# Patient Record
Sex: Female | Born: 1955 | Race: White | Hispanic: No | Marital: Married | Smoking: Never smoker
Health system: Southern US, Community
[De-identification: ages and names within clinical notes are randomized; demographics above are authoritative.]

## PROBLEM LIST (undated history)

## (undated) DIAGNOSIS — R319 Hematuria, unspecified: Secondary | ICD-10-CM

## (undated) DIAGNOSIS — R202 Paresthesia of skin: Secondary | ICD-10-CM

## (undated) DIAGNOSIS — L608 Other nail disorders: Secondary | ICD-10-CM

## (undated) DIAGNOSIS — R300 Dysuria: Secondary | ICD-10-CM

## (undated) DIAGNOSIS — G43909 Migraine, unspecified, not intractable, without status migrainosus: Secondary | ICD-10-CM

## (undated) DIAGNOSIS — B9681 Helicobacter pylori [H. pylori] as the cause of diseases classified elsewhere: Secondary | ICD-10-CM

## (undated) DIAGNOSIS — E559 Vitamin D deficiency, unspecified: Secondary | ICD-10-CM

## (undated) DIAGNOSIS — N644 Mastodynia: Secondary | ICD-10-CM

## (undated) DIAGNOSIS — I10 Essential (primary) hypertension: Secondary | ICD-10-CM

## (undated) DIAGNOSIS — H9190 Unspecified hearing loss, unspecified ear: Secondary | ICD-10-CM

## (undated) DIAGNOSIS — E049 Nontoxic goiter, unspecified: Secondary | ICD-10-CM

## (undated) DIAGNOSIS — R079 Chest pain, unspecified: Secondary | ICD-10-CM

## (undated) DIAGNOSIS — I8393 Asymptomatic varicose veins of bilateral lower extremities: Secondary | ICD-10-CM

## (undated) DIAGNOSIS — N200 Calculus of kidney: Secondary | ICD-10-CM

## (undated) DIAGNOSIS — K219 Gastro-esophageal reflux disease without esophagitis: Secondary | ICD-10-CM

## (undated) DIAGNOSIS — E785 Hyperlipidemia, unspecified: Secondary | ICD-10-CM

## (undated) HISTORY — DX: Dysuria: R30.0

## (undated) HISTORY — PX: TONSILLECTOMY: SUR1361

## (undated) HISTORY — DX: Paresthesia of skin: R20.2

## (undated) HISTORY — DX: Essential (primary) hypertension: I10

## (undated) HISTORY — DX: Nontoxic goiter, unspecified: E04.9

## (undated) HISTORY — DX: Hyperlipidemia, unspecified: E78.5

## (undated) HISTORY — DX: Other nail disorders: L60.8

## (undated) HISTORY — DX: Gastro-esophageal reflux disease without esophagitis: K21.9

## (undated) HISTORY — DX: Chest pain, unspecified: R07.9

## (undated) HISTORY — PX: EYE SURGERY: SHX253

## (undated) HISTORY — DX: Vitamin D deficiency, unspecified: E55.9

## (undated) HISTORY — DX: Asymptomatic varicose veins of bilateral lower extremities: I83.93

## (undated) HISTORY — DX: Unspecified hearing loss, unspecified ear: H91.90

## (undated) HISTORY — DX: Helicobacter pylori (H. pylori) as the cause of diseases classified elsewhere: B96.81

## (undated) HISTORY — DX: Mastodynia: N64.4

## (undated) HISTORY — DX: Migraine, unspecified, not intractable, without status migrainosus: G43.909

## (undated) HISTORY — DX: Calculus of kidney: N20.0

## (undated) HISTORY — DX: Hematuria, unspecified: R31.9

## (undated) HISTORY — PX: OTHER SURGICAL HISTORY: SHX169

---

## 1997-08-30 ENCOUNTER — Ambulatory Visit (HOSPITAL_COMMUNITY): Admission: RE | Admit: 1997-08-30 | Discharge: 1997-08-30 | Payer: Self-pay | Admitting: Gynecology

## 1997-12-06 ENCOUNTER — Other Ambulatory Visit: Admission: RE | Admit: 1997-12-06 | Discharge: 1997-12-06 | Payer: Self-pay | Admitting: Gynecology

## 1998-04-06 ENCOUNTER — Ambulatory Visit (HOSPITAL_COMMUNITY): Admission: RE | Admit: 1998-04-06 | Discharge: 1998-04-06 | Payer: Self-pay | Admitting: Gynecology

## 1998-04-06 ENCOUNTER — Encounter: Payer: Self-pay | Admitting: Gynecology

## 1998-09-02 ENCOUNTER — Encounter: Payer: Self-pay | Admitting: Gynecology

## 1998-09-02 ENCOUNTER — Ambulatory Visit (HOSPITAL_COMMUNITY): Admission: RE | Admit: 1998-09-02 | Discharge: 1998-09-02 | Payer: Self-pay | Admitting: Gynecology

## 1998-10-03 ENCOUNTER — Other Ambulatory Visit: Admission: RE | Admit: 1998-10-03 | Discharge: 1998-10-03 | Payer: Self-pay | Admitting: Gynecology

## 2000-01-15 ENCOUNTER — Other Ambulatory Visit: Admission: RE | Admit: 2000-01-15 | Discharge: 2000-01-15 | Payer: Self-pay | Admitting: Gynecology

## 2000-10-19 ENCOUNTER — Emergency Department (HOSPITAL_COMMUNITY): Admission: EM | Admit: 2000-10-19 | Discharge: 2000-10-19 | Payer: Self-pay

## 2000-10-19 ENCOUNTER — Encounter: Payer: Self-pay | Admitting: Emergency Medicine

## 2000-11-04 ENCOUNTER — Ambulatory Visit (HOSPITAL_BASED_OUTPATIENT_CLINIC_OR_DEPARTMENT_OTHER): Admission: RE | Admit: 2000-11-04 | Discharge: 2000-11-04 | Payer: Self-pay | Admitting: Orthopedic Surgery

## 2002-05-20 ENCOUNTER — Encounter: Payer: Self-pay | Admitting: Internal Medicine

## 2002-05-20 ENCOUNTER — Encounter: Admission: RE | Admit: 2002-05-20 | Discharge: 2002-05-20 | Payer: Self-pay | Admitting: Internal Medicine

## 2002-05-27 ENCOUNTER — Encounter: Admission: RE | Admit: 2002-05-27 | Discharge: 2002-05-27 | Payer: Self-pay | Admitting: Internal Medicine

## 2002-05-27 ENCOUNTER — Encounter: Payer: Self-pay | Admitting: Internal Medicine

## 2003-05-31 ENCOUNTER — Encounter: Admission: RE | Admit: 2003-05-31 | Discharge: 2003-05-31 | Payer: Self-pay | Admitting: Internal Medicine

## 2004-08-22 ENCOUNTER — Other Ambulatory Visit: Admission: RE | Admit: 2004-08-22 | Discharge: 2004-08-22 | Payer: Self-pay | Admitting: Gynecology

## 2004-09-20 ENCOUNTER — Encounter: Admission: RE | Admit: 2004-09-20 | Discharge: 2004-09-20 | Payer: Self-pay | Admitting: Internal Medicine

## 2005-03-05 ENCOUNTER — Encounter: Admission: RE | Admit: 2005-03-05 | Discharge: 2005-03-05 | Payer: Self-pay | Admitting: Otolaryngology

## 2005-10-23 ENCOUNTER — Encounter: Admission: RE | Admit: 2005-10-23 | Discharge: 2005-10-23 | Payer: Self-pay | Admitting: Internal Medicine

## 2005-10-29 ENCOUNTER — Encounter: Admission: RE | Admit: 2005-10-29 | Discharge: 2005-10-29 | Payer: Self-pay | Admitting: Internal Medicine

## 2005-12-04 ENCOUNTER — Ambulatory Visit: Payer: Self-pay | Admitting: Gastroenterology

## 2005-12-19 ENCOUNTER — Ambulatory Visit: Payer: Self-pay | Admitting: Gastroenterology

## 2006-01-10 ENCOUNTER — Ambulatory Visit: Payer: Self-pay | Admitting: Gastroenterology

## 2006-11-06 ENCOUNTER — Encounter: Admission: RE | Admit: 2006-11-06 | Discharge: 2006-11-06 | Payer: Self-pay | Admitting: Gynecology

## 2006-11-19 ENCOUNTER — Ambulatory Visit: Payer: Self-pay | Admitting: Gastroenterology

## 2006-12-04 ENCOUNTER — Encounter: Payer: Self-pay | Admitting: Gastroenterology

## 2006-12-04 ENCOUNTER — Ambulatory Visit: Payer: Self-pay | Admitting: Gastroenterology

## 2006-12-04 DIAGNOSIS — K2970 Gastritis, unspecified, without bleeding: Secondary | ICD-10-CM | POA: Insufficient documentation

## 2006-12-04 DIAGNOSIS — K2990 Gastroduodenitis, unspecified, without bleeding: Secondary | ICD-10-CM

## 2008-01-15 ENCOUNTER — Encounter: Admission: RE | Admit: 2008-01-15 | Discharge: 2008-01-15 | Payer: Self-pay | Admitting: Internal Medicine

## 2009-01-27 ENCOUNTER — Encounter: Admission: RE | Admit: 2009-01-27 | Discharge: 2009-01-27 | Payer: Self-pay | Admitting: Internal Medicine

## 2009-03-22 ENCOUNTER — Encounter: Payer: Self-pay | Admitting: Physician Assistant

## 2009-03-28 ENCOUNTER — Ambulatory Visit: Payer: Self-pay | Admitting: Gastroenterology

## 2009-03-28 ENCOUNTER — Telehealth: Payer: Self-pay | Admitting: Gastroenterology

## 2009-03-28 DIAGNOSIS — R1032 Left lower quadrant pain: Secondary | ICD-10-CM | POA: Insufficient documentation

## 2009-03-28 DIAGNOSIS — K5900 Constipation, unspecified: Secondary | ICD-10-CM | POA: Insufficient documentation

## 2009-04-01 ENCOUNTER — Telehealth: Payer: Self-pay | Admitting: Physician Assistant

## 2009-04-06 ENCOUNTER — Telehealth (INDEPENDENT_AMBULATORY_CARE_PROVIDER_SITE_OTHER): Payer: Self-pay | Admitting: *Deleted

## 2010-02-22 ENCOUNTER — Encounter: Admission: RE | Admit: 2010-02-22 | Discharge: 2010-02-22 | Payer: Self-pay | Admitting: Internal Medicine

## 2010-09-12 NOTE — Assessment & Plan Note (Signed)
[facility]                         GASTROENTEROLOGY OFFICE NOTE   YAZLEEN, MOLOCK                      MRN:          [id number]  DATE:11/19/2006                            DOB:          [birth year]    Mrs. Hepner recently had guaiac-positive stool on Hemoccult testing.  Her  last colonoscopy was August of 2007 which was negative, although she had  extremely poor colon prep.  She now relates that she does have some  relatives in [place] who have had colon polyps.   Mrs. Lento denies any GI complaints.  She takes as many as 4-6 Aleve  tablets a day for migraine headaches.  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  I will go ahead and repeat her colonoscopy and endoscopy,  complete her workup with further clinical action depending on the  results of these tests.  Recent CBC and metabolic profile by Dr. Timothy Lasso  was normal.     Vania Rea. Jarold Motto, MD, Caleen Essex, FAGA  Electronically Signed    DRP/MedQ  DD: 11/19/2006  DT: 11/19/2006  Job #: [id number]   cc:   Gwen Pounds, MD

## 2010-09-15 NOTE — Op Note (Signed)
[place]  Patient:    Ashley Cline, Ashley Cline                      MRN: [id number] Proc. Date: 11/04/00 Adm. Date:  [id number] Attending:  Marlowe Shores                           Operative Report  PREOPERATIVE DIAGNOSES:  Left distal radius fracture.  POSTOPERATIVE DIAGNOSES:  Left distal radius fracture.  PROCEDURE:  Open reduction internal fixation of above fracture using DVR plate and screws.  SURGEON:  Artist Pais. Mina Marble, M.D.  ASSISTANT:  RN  ANESTHESIA:  Axillary block.  TOURNIQUET TIME:  45 minutes.  COMPLICATIONS:  None.  DRAINS:  None.  OPERATIVE REPORT:  Patient was taken to the operating room after induction of adequate axillary block anesthesia and IV sedation.  Left upper extremity was prepped and draped in the usual sterile fashion.  An Esmarch was used to exsanguinate the limb.  Tourniquet was inflated to 250 mmHg at this point in time.  The standard Sherilyn Cooter approach to the distal radius on the lower side was undertaken.  A 6 cm incision was made over the FCR tendon.  Incision was taken down through the skin and subcutaneous tissues until the sheath overlying the FCR tendon was identified.  This was split longitudinally.  The FCR was retracted to the midline, the radial artery laterally, and this interval was developed.  The ______ was identified and the subperiostea dissected off the distal radius.  The fracture was debrided of clot.  Reduction was performed. The plate was then placed volarly using the preset screws and pegs. Intraoperative x-rays showed good reduction in both the AP and lateral planes. The ______ was then repaired using 3-0 Vicryl and the skin with a running 3-0 Prolene subcuticular stitch.  Steri-Strips, 4 x 4 Plus, compression dressing was applied.  Patient tolerated well and went to recovery in stable condition. DD:  11/04/00 TD:  11/04/00 Job: [id number]

## 2010-09-15 NOTE — Assessment & Plan Note (Signed)
[facility]                           GASTROENTEROLOGY OFFICE NOTE   LOURENE, HOSTON                      MRN:          [id number]  DATE:01/10/2006                            DOB:          December 26, 1955    A screening colonoscopy December 19, 2005 was unremarkable, although there was  extremely poor colon prep.  I therefore advised her to have followup exam in  5 years time with a Vascol least possible sodium pill prep.  She denies any  GI complaints whatsoever.  Has a negative family history of any  gastrointestinal problems.  She has agreed to this plan.  Continue medical  followup with Dr. Timothy Lasso as previously as planned.                                   Vania Rea. Jarold Motto, MD, Clementeen Graham, [place]   DRP/MedQ  DD:  01/10/2006  DT:  01/11/2006  Job #:  [id number]

## 2010-09-15 NOTE — Consult Note (Signed)
[place]  Patient:    Ashley Cline, Ashley Cline                      MRN: [id number] Proc. Date: 10/19/00 Adm. Date:  [id number] Disc. Date: [id number] Attending:  Armanda Heritage CC:         Earlyne Iba, M.D.   Consultation Report  REQUESTING PHYSICIAN:  Earlyne Iba, M.D.  REASON FOR CONSULTATION:  Ashley Cline is a very pleasant 55 year old right-hand dominant female who fell earlier today in her garden sustaining trauma to her left upper extremity and presents today with pain and deformity to her left hand and wrist.  She is an otherwise healthy 55 year old female with no known drug allergies, no current medications, no recent hospitalization or surgery.  FAMILY MEDICAL HISTORY:  Noncontributory.  SOCIAL HISTORY:  Noncontributory.  PHYSICAL EXAMINATION:  A well-developed well-nourished female, pleasant and alert and oriented x 3.  On examination of her left upper extremity, she has an obvious injury to her wrist area with dorsally displaced hand on the wrist.  She is neurovascularly intact grossly.  Her median, ulnar and radial nerves are functional.  She has 2+ radial pulses which refill.  There is no erythema, drainage or sign of infection.  She can move her digits but again there is an obvious deformity to the hand and wrist.  X-rays show a dorsally displaced fracture of the distal radius with some slight interarticular involvement in the area of the radial styloid.  Today she was given some IV sedation of morphine and Phenergan and then a 2% plain lidocaine hematoma block was administered.  Once adequate anesthesia was obtained she was placed in finger trap traction with 10 pounds of countertraction across the radial carpal joints and a closed reduction was performed.  She was placed in sugar-tong splint.  Postoperative films showed adequate reduction in both the AP and lateral planes.  She was discharged from the emergency department  with Percocet for pain 1 or 2 every 3-4 hours as needed #30, no refills and is to follow up in my office on Tuesday, October 22, 2000.  I discussed with Ashley Cline and her husband that at this point in time the reduction appears to be fairly good but because of the nature of this fracture she could be displaced and this might require operative fixation.  They are also to get back to me immediately with any signs of neurovascular compromise or acute carpal tunnel syndrome.  Also of note she has 4 rings on the ring finger on that side and we were unable to remove them here in the emergency department, however, her finger looks viable and there is no evidence of congestion, however, should there be any discoloration of the finger, coldness, numbness, etc they are to contact me immediately in which case the rings will have to be removed. DD:  10/19/00 TD:  10/21/00 Job: [id number]

## 2011-02-05 ENCOUNTER — Encounter: Payer: Self-pay | Admitting: Gastroenterology

## 2011-03-07 ENCOUNTER — Other Ambulatory Visit: Payer: Self-pay | Admitting: Internal Medicine

## 2011-03-07 DIAGNOSIS — Z1231 Encounter for screening mammogram for malignant neoplasm of breast: Secondary | ICD-10-CM

## 2011-03-13 ENCOUNTER — Ambulatory Visit
Admission: RE | Admit: 2011-03-13 | Discharge: 2011-03-13 | Disposition: A | Discharge status: Home / Self Care | Payer: BC Managed Care – PPO | Source: Ambulatory Visit | Attending: Internal Medicine | Admitting: Internal Medicine

## 2011-03-13 DIAGNOSIS — Z1231 Encounter for screening mammogram for malignant neoplasm of breast: Secondary | ICD-10-CM

## 2012-03-24 ENCOUNTER — Other Ambulatory Visit: Payer: Self-pay | Admitting: Internal Medicine

## 2012-03-24 DIAGNOSIS — Z1231 Encounter for screening mammogram for malignant neoplasm of breast: Secondary | ICD-10-CM

## 2012-03-25 ENCOUNTER — Ambulatory Visit
Admission: RE | Admit: 2012-03-25 | Discharge: 2012-03-25 | Disposition: A | Discharge status: Home / Self Care | Payer: BC Managed Care – PPO | Source: Ambulatory Visit | Attending: Internal Medicine | Admitting: Internal Medicine

## 2012-03-25 DIAGNOSIS — Z1231 Encounter for screening mammogram for malignant neoplasm of breast: Secondary | ICD-10-CM

## 2012-04-22 ENCOUNTER — Ambulatory Visit: Payer: BC Managed Care – PPO

## 2013-03-03 ENCOUNTER — Other Ambulatory Visit: Payer: Self-pay

## 2013-03-03 DIAGNOSIS — Z1231 Encounter for screening mammogram for malignant neoplasm of breast: Secondary | ICD-10-CM

## 2013-04-03 ENCOUNTER — Ambulatory Visit
Admission: RE | Admit: 2013-04-03 | Discharge: 2013-04-03 | Disposition: A | Discharge status: Home / Self Care | Payer: BC Managed Care – PPO | Source: Ambulatory Visit

## 2013-04-03 DIAGNOSIS — Z1231 Encounter for screening mammogram for malignant neoplasm of breast: Secondary | ICD-10-CM

## 2013-07-01 ENCOUNTER — Other Ambulatory Visit: Payer: Self-pay | Admitting: Internal Medicine

## 2013-07-01 DIAGNOSIS — R3129 Other microscopic hematuria: Secondary | ICD-10-CM

## 2013-07-03 ENCOUNTER — Ambulatory Visit
Admission: RE | Admit: 2013-07-03 | Discharge: 2013-07-03 | Disposition: A | Discharge status: Home / Self Care | Payer: BC Managed Care – PPO | Source: Ambulatory Visit | Attending: Internal Medicine | Admitting: Internal Medicine

## 2013-07-03 DIAGNOSIS — R3129 Other microscopic hematuria: Secondary | ICD-10-CM

## 2014-05-07 ENCOUNTER — Other Ambulatory Visit: Payer: Self-pay

## 2014-05-07 DIAGNOSIS — Z1231 Encounter for screening mammogram for malignant neoplasm of breast: Secondary | ICD-10-CM

## 2014-05-19 ENCOUNTER — Ambulatory Visit
Admission: RE | Admit: 2014-05-19 | Discharge: 2014-05-19 | Disposition: A | Discharge status: Home / Self Care | Payer: BLUE CROSS/BLUE SHIELD | Source: Ambulatory Visit

## 2014-05-19 DIAGNOSIS — Z1231 Encounter for screening mammogram for malignant neoplasm of breast: Secondary | ICD-10-CM

## 2015-02-18 ENCOUNTER — Inpatient Hospital Stay (HOSPITAL_COMMUNITY)
Admission: AD | Admit: 2015-02-18 | Discharge: 2015-02-18 | Discharge status: Against Medical Advice | Payer: BLUE CROSS/BLUE SHIELD | Source: Ambulatory Visit | Attending: Obstetrics and Gynecology | Admitting: Obstetrics and Gynecology

## 2015-02-18 NOTE — MAU Note (Signed)
Pt signed in with pain stated now she has no more and wants to go home. Told her would would be glad to check her out but stated she would see Dr. Virgina Jock (her family doctor) on Monday.

## 2015-05-18 ENCOUNTER — Other Ambulatory Visit: Payer: Self-pay

## 2015-05-18 DIAGNOSIS — Z1231 Encounter for screening mammogram for malignant neoplasm of breast: Secondary | ICD-10-CM

## 2015-06-08 ENCOUNTER — Ambulatory Visit
Admission: RE | Admit: 2015-06-08 | Discharge: 2015-06-08 | Disposition: A | Discharge status: Home / Self Care | Payer: BLUE CROSS/BLUE SHIELD | Source: Ambulatory Visit

## 2015-06-08 DIAGNOSIS — Z1231 Encounter for screening mammogram for malignant neoplasm of breast: Secondary | ICD-10-CM

## 2016-06-21 ENCOUNTER — Other Ambulatory Visit: Payer: Self-pay | Admitting: Otolaryngology

## 2016-06-21 DIAGNOSIS — H6043 Cholesteatoma of external ear, bilateral: Secondary | ICD-10-CM

## 2016-06-21 DIAGNOSIS — H6612 Chronic tubotympanic suppurative otitis media, left ear: Secondary | ICD-10-CM

## 2016-06-29 ENCOUNTER — Ambulatory Visit
Admission: RE | Admit: 2016-06-29 | Discharge: 2016-06-29 | Disposition: A | Discharge status: Home / Self Care | Payer: BLUE CROSS/BLUE SHIELD | Source: Ambulatory Visit | Attending: Otolaryngology | Admitting: Otolaryngology

## 2016-06-29 DIAGNOSIS — H6043 Cholesteatoma of external ear, bilateral: Secondary | ICD-10-CM

## 2016-06-29 DIAGNOSIS — H6612 Chronic tubotympanic suppurative otitis media, left ear: Secondary | ICD-10-CM

## 2016-06-29 MED ORDER — IOPAMIDOL (ISOVUE-300) INJECTION 61%
75.0000 mL | Freq: Once | INTRAVENOUS | Status: AC | PRN
  Administered 2016-06-29: 75 mL via INTRAVENOUS

## 2016-07-10 ENCOUNTER — Other Ambulatory Visit: Payer: Self-pay | Admitting: Internal Medicine

## 2016-07-10 DIAGNOSIS — Z1231 Encounter for screening mammogram for malignant neoplasm of breast: Secondary | ICD-10-CM

## 2016-07-13 ENCOUNTER — Ambulatory Visit
Admission: RE | Admit: 2016-07-13 | Discharge: 2016-07-13 | Disposition: A | Discharge status: Home / Self Care | Payer: BLUE CROSS/BLUE SHIELD | Source: Ambulatory Visit | Attending: Internal Medicine | Admitting: Internal Medicine

## 2016-07-13 DIAGNOSIS — Z1231 Encounter for screening mammogram for malignant neoplasm of breast: Secondary | ICD-10-CM

## 2016-10-01 ENCOUNTER — Other Ambulatory Visit: Payer: Self-pay | Admitting: Otolaryngology

## 2016-10-22 ENCOUNTER — Encounter: Payer: Self-pay | Admitting: Gastroenterology

## 2017-02-04 ENCOUNTER — Encounter: Payer: Self-pay | Admitting: Gastroenterology

## 2017-03-29 ENCOUNTER — Other Ambulatory Visit: Payer: Self-pay

## 2017-03-29 ENCOUNTER — Ambulatory Visit (AMBULATORY_SURGERY_CENTER): Payer: Self-pay | Admitting: *Deleted

## 2017-03-29 VITALS — Ht 62.0 in | Wt 141.0 lb

## 2017-03-29 DIAGNOSIS — Z1211 Encounter for screening for malignant neoplasm of colon: Secondary | ICD-10-CM

## 2017-03-29 NOTE — Progress Notes (Signed)
No egg or soy allergy known to patient  No issues with past sedation with any surgeries  or procedures, no intubation problems  No diet pills per patient No home 02 use per patient  No blood thinners per patient  Pt has  issues with constipation takes miralax daily No A fib or A flutter  EMMI video sent to pt's e mail pt. Declined  Sample of Plenvu given to patient [id number] lot# Exp.  09/2018

## 2017-04-12 ENCOUNTER — Ambulatory Visit (AMBULATORY_SURGERY_CENTER): Payer: BLUE CROSS/BLUE SHIELD | Admitting: Gastroenterology

## 2017-04-12 ENCOUNTER — Other Ambulatory Visit: Payer: Self-pay

## 2017-04-12 ENCOUNTER — Encounter: Payer: Self-pay | Admitting: Gastroenterology

## 2017-04-12 VITALS — BP 106/75 | HR 62 | Temp 96.4°F | Resp 14 | Ht 62.0 in | Wt 141.0 lb

## 2017-04-12 DIAGNOSIS — D123 Benign neoplasm of transverse colon: Secondary | ICD-10-CM | POA: Diagnosis not present

## 2017-04-12 DIAGNOSIS — Z1211 Encounter for screening for malignant neoplasm of colon: Secondary | ICD-10-CM | POA: Diagnosis not present

## 2017-04-12 MED ORDER — SODIUM CHLORIDE 0.9 % IV SOLN: 500.0000 mL | INTRAVENOUS | Status: DC

## 2017-04-12 NOTE — Progress Notes (Signed)
Report to PACU, RN, vss, BBS= Clear.  

## 2017-04-12 NOTE — Progress Notes (Signed)
Called to room to assist during endoscopic procedure.  Patient ID and intended procedure confirmed with present staff. Received instructions for my participation in the procedure from the performing physician.  

## 2017-04-12 NOTE — Progress Notes (Signed)
Pt. Reports no change in her medical or surgical history since her pre-visit 03/29/2017.

## 2017-04-12 NOTE — Op Note (Addendum)
[place] Patient Name: Ashley Cline Procedure Date: 04/12/2017 9:12 AM MRN: [id number] Endoscopist: Mauri Pole , MD Age: 61 Referring MD:  Date of Birth: 1955-07-16 Gender: Female Account #: [id number] Procedure:                Colonoscopy Indications:              Screening for colorectal malignant neoplasm, Last                            colonoscopy: 2008 Medicines:                Monitored Anesthesia Care Procedure:                Pre-Anesthesia Assessment:                           - Prior to the procedure, a History and Physical                            was performed, and patient medications and                            allergies were reviewed. The patient's tolerance of                            previous anesthesia was also reviewed. The risks                            and benefits of the procedure and the sedation                            options and risks were discussed with the patient.                            All questions were answered, and informed consent                            was obtained. Prior Anticoagulants: The patient has                            taken no previous anticoagulant or antiplatelet                            agents. ASA Grade Assessment: I - A normal, healthy                            patient. After reviewing the risks and benefits,                            the patient was deemed in satisfactory condition to                            undergo the procedure.  After obtaining informed consent, the colonoscope                            was passed under direct vision. Throughout the                            procedure, the patient's blood pressure, pulse, and                            oxygen saturations were monitored continuously. The                            Colonoscope was introduced through the anus and                            advanced to the the cecum, identified by                             appendiceal orifice and ileocecal valve. The                            colonoscopy was somewhat difficult due to                            inadequate bowel prep. The patient tolerated the                            procedure well. The quality of the bowel                            preparation was inadequate. The ileocecal valve,                            appendiceal orifice, and rectum were photographed. Scope In: 9:22:31 AM Scope Out: 9:36:00 AM Scope Withdrawal Time: 0 hours 9 minutes 0 seconds  Total Procedure Duration: 0 hours 13 minutes 29 seconds  Findings:                 The perianal and digital rectal examinations were                            normal.                           A 7 mm polyp was found in the transverse colon. The                            polyp was sessile. The polyp was removed with a                            cold snare. Resection and retrieval were complete.                           Non-bleeding internal hemorrhoids were found during  retroflexion. The hemorrhoids were small. Complications:            No immediate complications. Estimated Blood Loss:     Estimated blood loss was minimal. Impression:               - Preparation of the colon was inadequate.                           - One 7 mm polyp in the transverse colon, removed                            with a cold snare. Resected and retrieved.                           - Non-bleeding internal hemorrhoids. Recommendation:           - Patient has a contact number available for                            emergencies. The signs and symptoms of potential                            delayed complications were discussed with the                            patient. Return to normal activities tomorrow.                            Written discharge instructions were provided to the                            patient.                           - Resume previous  diet.                           - Continue present medications.                           - Await pathology results.                           - Repeat colonoscopy at the next available                            appointment because the bowel preparation was                            suboptimal.                           - Return to GI clinic PRN for hemorrhoidal band                            ligation. Mauri Pole, MD 04/12/2017 9:40:38 AM This report has been signed electronically.

## 2017-04-12 NOTE — Patient Instructions (Addendum)
YOU HAD AN ENDOSCOPIC PROCEDURE TODAY AT [facility]:   Refer to the procedure report that was given to you for any specific questions about what was found during the examination.  If the procedure report does not answer your questions, please call your gastroenterologist to clarify.  If you requested that your care partner not be given the details of your procedure findings, then the procedure report has been included in a sealed envelope for you to review at your convenience later.  YOU SHOULD EXPECT: Some feelings of bloating in the abdomen. Passage of more gas than usual.  Walking can help get rid of the air that was put into your GI tract during the procedure and reduce the bloating. If you had a lower endoscopy (such as a colonoscopy or flexible sigmoidoscopy) you may notice spotting of blood in your stool or on the toilet paper. If you underwent a bowel prep for your procedure, you may not have a normal bowel movement for a few days.  Please Note:  You might notice some irritation and congestion in your nose or some drainage.  This is from the oxygen used during your procedure.  There is no need for concern and it should clear up in a day or so.  SYMPTOMS TO REPORT IMMEDIATELY:   Following lower endoscopy (colonoscopy or flexible sigmoidoscopy):  Excessive amounts of blood in the stool  Significant tenderness or worsening of abdominal pains  Swelling of the abdomen that is new, acute  Fever of 100F or higher  For urgent or emergent issues, a gastroenterologist can be reached at any hour by calling [phone].   DIET:  We do recommend a small meal at first, but then you may proceed to your regular diet.  Drink plenty of fluids but you should avoid alcoholic beverages for 24 hours.  ACTIVITY:  You should plan to take it easy for the rest of today and you should NOT DRIVE or use heavy machinery until tomorrow (because of the sedation medicines used during the test).     FOLLOW UP: Our staff will call the number listed on your records the next business day following your procedure to check on you and address any questions or concerns that you may have regarding the information given to you following your procedure. If we do not reach you, we will leave a message.  However, if you are feeling well and you are not experiencing any problems, there is no need to return our call.  We will assume that you have returned to your regular daily activities without incident.  If any biopsies were taken you will be contacted by phone or by letter within the next 1-3 weeks.  Please call us at [phone] if you have not heard about the biopsies in 3 weeks.   Await for biopsy  Return to GI next week for repeat Colonoscopy Hemorrhoid (handout given) Hemorrhoid Banding (handout given)   SIGNATURES/CONFIDENTIALITY: You and/or your care partner have signed paperwork which will be entered into your electronic medical record.  These signatures attest to the fact that that the information above on your After Visit Summary has been reviewed and is understood.  Full responsibility of the confidentiality of this discharge information lies with you and/or your care-partner.

## 2017-04-15 ENCOUNTER — Ambulatory Visit (AMBULATORY_SURGERY_CENTER): Payer: Self-pay | Admitting: *Deleted

## 2017-04-15 ENCOUNTER — Other Ambulatory Visit: Payer: Self-pay

## 2017-04-15 ENCOUNTER — Telehealth: Payer: Self-pay | Admitting: *Deleted

## 2017-04-15 VITALS — Ht 62.0 in | Wt 137.0 lb

## 2017-04-15 DIAGNOSIS — Z1211 Encounter for screening for malignant neoplasm of colon: Secondary | ICD-10-CM

## 2017-04-15 MED ORDER — NA SULFATE-K SULFATE-MG SULF 17.5-3.13-1.6 GM/177ML PO SOLN: ORAL | 0 refills | Status: DC

## 2017-04-15 NOTE — Telephone Encounter (Signed)
  Follow up Call-  Call back number 04/12/2017  Post procedure Call Back phone  # [phone] or [phone]  Permission to leave phone message Yes  Some recent data might be hidden     Patient questions:  Do you have a fever, pain , or abdominal swelling? No. Pain Score  0 *  Have you tolerated food without any problems? Yes.    Have you been able to return to your normal activities? Yes.    Do you have any questions about your discharge instructions: Diet   No. Medications  No. Follow up visit  No.  Do you have questions or concerns about your Care? No.  Actions: * If pain score is 4 or above: No action needed, pain <4.

## 2017-04-15 NOTE — Progress Notes (Signed)
Patient denies any allergies to eggs or soy. Patient denies any problems with anesthesia/sedation. Patient denies any oxygen use at home. Patient denies taking any diet/weight loss medications or blood thinners. Patient was given 2 day prep instructions for Miralax/Dulcolax and Suprep. Pt verbalizes understanding of prep.

## 2017-04-19 ENCOUNTER — Other Ambulatory Visit: Payer: Self-pay

## 2017-04-19 ENCOUNTER — Encounter: Payer: Self-pay | Admitting: Gastroenterology

## 2017-04-19 ENCOUNTER — Ambulatory Visit (AMBULATORY_SURGERY_CENTER): Payer: BLUE CROSS/BLUE SHIELD | Admitting: Gastroenterology

## 2017-04-19 VITALS — BP 115/56 | HR 63 | Temp 98.4°F | Resp 16 | Ht 62.0 in | Wt 137.0 lb

## 2017-04-19 DIAGNOSIS — D122 Benign neoplasm of ascending colon: Secondary | ICD-10-CM | POA: Diagnosis not present

## 2017-04-19 DIAGNOSIS — K635 Polyp of colon: Secondary | ICD-10-CM | POA: Diagnosis not present

## 2017-04-19 DIAGNOSIS — Z1211 Encounter for screening for malignant neoplasm of colon: Secondary | ICD-10-CM

## 2017-04-19 DIAGNOSIS — D124 Benign neoplasm of descending colon: Secondary | ICD-10-CM

## 2017-04-19 MED ORDER — SODIUM CHLORIDE 0.9 % IV SOLN: 500.0000 mL | Freq: Once | INTRAVENOUS | Status: DC

## 2017-04-19 NOTE — Patient Instructions (Signed)
**  Handouts given on polyps and hemorrhoids**   YOU HAD AN ENDOSCOPIC PROCEDURE TODAY: Refer to the procedure report and other information in the discharge instructions given to you for any specific questions about what was found during the examination. If this information does not answer your questions, please call [place] office at [phone] to clarify.   YOU SHOULD EXPECT: Some feelings of bloating in the abdomen. Passage of more gas than usual. Walking can help get rid of the air that was put into your GI tract during the procedure and reduce the bloating. If you had a lower endoscopy (such as a colonoscopy or flexible sigmoidoscopy) you may notice spotting of blood in your stool or on the toilet paper. Some abdominal soreness may be present for a day or two, also.  DIET: Your first meal following the procedure should be a light meal and then it is ok to progress to your normal diet. A half-sandwich or bowl of soup is an example of a good first meal. Heavy or fried foods are harder to digest and may make you feel nauseous or bloated. Drink plenty of fluids but you should avoid alcoholic beverages for 24 hours. If you had a esophageal dilation, please see attached instructions for diet.    ACTIVITY: Your care partner should take you home directly after the procedure. You should plan to take it easy, moving slowly for the rest of the day. You can resume normal activity the day after the procedure however YOU SHOULD NOT DRIVE, use power tools, machinery or perform tasks that involve climbing or major physical exertion for 24 hours (because of the sedation medicines used during the test).   SYMPTOMS TO REPORT IMMEDIATELY: A gastroenterologist can be reached at any hour. Please call [phone]  for any of the following symptoms:  Following lower endoscopy (colonoscopy, flexible sigmoidoscopy) Excessive amounts of blood in the stool  Significant tenderness, worsening of abdominal pains  Swelling of  the abdomen that is new, acute  Fever of 100 or higher    FOLLOW UP:  If any biopsies were taken you will be contacted by phone or by letter within the next 1-3 weeks. Call [phone]  if you have not heard about the biopsies in 3 weeks.  Please also call with any specific questions about appointments or follow up tests.  

## 2017-04-19 NOTE — Progress Notes (Signed)
Report to PACU, RN, vss, BBS= Clear.  

## 2017-04-19 NOTE — Op Note (Signed)
[place] Patient Name: Ashley Cline Procedure Date: 04/19/2017 1:29 PM MRN: [id number] Endoscopist: Mauri Pole , MD Age: 61 Referring MD:  Date of Birth: June 30, 1955 Gender: Female Account #: [id number] Procedure:                Colonoscopy Indications:              High risk colon cancer surveillance: Personal                            history of colonic polyps Medicines:                Monitored Anesthesia Care Procedure:                Pre-Anesthesia Assessment:                           - Prior to the procedure, a History and Physical                            was performed, and patient medications and                            allergies were reviewed. The patient's tolerance of                            previous anesthesia was also reviewed. The risks                            and benefits of the procedure and the sedation                            options and risks were discussed with the patient.                            All questions were answered, and informed consent                            was obtained. Prior Anticoagulants: The patient has                            taken no previous anticoagulant or antiplatelet                            agents. ASA Grade Assessment: II - A patient with                            mild systemic disease. After reviewing the risks                            and benefits, the patient was deemed in                            satisfactory condition to undergo the procedure.  After obtaining informed consent, the colonoscope                            was passed under direct vision. Throughout the                            procedure, the patient's blood pressure, pulse, and                            oxygen saturations were monitored continuously. The                            Colonoscope was introduced through the anus and                            advanced to the the cecum,  identified by                            appendiceal orifice and ileocecal valve. The                            colonoscopy was performed without difficulty. The                            patient tolerated the procedure well. The quality                            of the bowel preparation was excellent. The                            ileocecal valve, appendiceal orifice, and rectum                            were photographed. Scope In: 1:33:36 PM Scope Out: 1:59:10 PM Scope Withdrawal Time: 0 hours 15 minutes 30 seconds  Total Procedure Duration: 0 hours 25 minutes 34 seconds  Findings:                 The perianal and digital rectal examinations were                            normal.                           A 7 mm polyp was found in the ascending colon. The                            polyp was sessile. The polyp was removed with a                            cold snare. Resection and retrieval were complete.                           A 6 mm polyp was found in the descending colon. The  polyp was semi-pedunculated. The polyp was removed                            with a hot snare. Resection and retrieval were                            complete.                           Non-bleeding internal hemorrhoids were found during                            retroflexion. The hemorrhoids were small. Complications:            No immediate complications. Estimated Blood Loss:     Estimated blood loss was minimal. Impression:               - One 7 mm polyp in the ascending colon, removed                            with a cold snare. Resected and retrieved.                           - One 6 mm polyp in the descending colon, removed                            with a hot snare. Resected and retrieved.                           - Non-bleeding internal hemorrhoids. Recommendation:           - Patient has a contact number available for                            emergencies.  The signs and symptoms of potential                            delayed complications were discussed with the                            patient. Return to normal activities tomorrow.                            Written discharge instructions were provided to the                            patient.                           - Resume previous diet.                           - Continue present medications.                           - Await pathology results.                           -  Repeat colonoscopy in 3 - 5 years for                            surveillance based on pathology results. Mauri Pole, MD 04/19/2017 2:04:54 PM This report has been signed electronically.

## 2017-04-19 NOTE — Progress Notes (Signed)
Called to room to assist during endoscopic procedure.  Patient ID and intended procedure confirmed with present staff. Received instructions for my participation in the procedure from the performing physician.  

## 2017-04-22 ENCOUNTER — Telehealth: Payer: Self-pay | Admitting: *Deleted

## 2017-04-22 NOTE — Telephone Encounter (Signed)
  Follow up Call-  Call back number 04/19/2017 04/12/2017  Post procedure Call Back phone  # [phone] or [phone]  Permission to leave phone message Yes Yes  Some recent data might be hidden     Patient questions:  Spoke with husband Rodman Key.  He states that patient is doing well.

## 2017-04-22 NOTE — Telephone Encounter (Signed)
  Follow up Call-  Call back number 04/19/2017 04/12/2017  Post procedure Call Back phone  # [phone] or [phone]  Permission to leave phone message Yes Yes  Some recent data might be hidden    [facility]

## 2017-04-25 ENCOUNTER — Encounter: Payer: Self-pay | Admitting: Gastroenterology

## 2017-06-20 ENCOUNTER — Other Ambulatory Visit: Payer: Self-pay | Admitting: Internal Medicine

## 2017-06-20 DIAGNOSIS — Z1231 Encounter for screening mammogram for malignant neoplasm of breast: Secondary | ICD-10-CM

## 2017-07-30 ENCOUNTER — Ambulatory Visit
Admission: RE | Admit: 2017-07-30 | Discharge: 2017-07-30 | Disposition: A | Discharge status: Home / Self Care | Payer: BLUE CROSS/BLUE SHIELD | Source: Ambulatory Visit | Attending: Internal Medicine | Admitting: Internal Medicine

## 2017-07-30 ENCOUNTER — Ambulatory Visit: Payer: BLUE CROSS/BLUE SHIELD

## 2017-07-30 DIAGNOSIS — Z1231 Encounter for screening mammogram for malignant neoplasm of breast: Secondary | ICD-10-CM

## 2017-07-31 ENCOUNTER — Other Ambulatory Visit: Payer: Self-pay | Admitting: Internal Medicine

## 2017-07-31 DIAGNOSIS — R928 Other abnormal and inconclusive findings on diagnostic imaging of breast: Secondary | ICD-10-CM

## 2017-08-05 ENCOUNTER — Ambulatory Visit
Admission: RE | Admit: 2017-08-05 | Discharge: 2017-08-05 | Disposition: A | Discharge status: Home / Self Care | Payer: BLUE CROSS/BLUE SHIELD | Source: Ambulatory Visit | Attending: Internal Medicine | Admitting: Internal Medicine

## 2017-08-05 DIAGNOSIS — R928 Other abnormal and inconclusive findings on diagnostic imaging of breast: Secondary | ICD-10-CM

## 2019-04-14 ENCOUNTER — Other Ambulatory Visit: Payer: Self-pay

## 2019-04-14 ENCOUNTER — Encounter (INDEPENDENT_AMBULATORY_CARE_PROVIDER_SITE_OTHER): Payer: Self-pay | Admitting: Otolaryngology

## 2019-04-14 ENCOUNTER — Ambulatory Visit (INDEPENDENT_AMBULATORY_CARE_PROVIDER_SITE_OTHER): Payer: Medicare Other | Admitting: Otolaryngology

## 2019-04-14 VITALS — Temp 97.7°F

## 2019-04-14 DIAGNOSIS — H60312 Diffuse otitis externa, left ear: Secondary | ICD-10-CM | POA: Diagnosis not present

## 2019-04-14 DIAGNOSIS — H6123 Impacted cerumen, bilateral: Secondary | ICD-10-CM

## 2019-04-14 NOTE — Progress Notes (Addendum)
HPI: Ashley Cline is a 63 y.o. female who returns today for evaluation of cerumen cerumen buildup in her ears.  She was previously followed by Dr. Thornell Mule.  She had a BAHA implant placed on the right side in 2016 by Dr. Thornell Mule.  She has had chronic problems with her ear canals.  She has had previous tympanoplasty on the left side a couple of times.  She presents today to have her ears cleaned. She is also had a little bit of the cough that has been dry.  She presents with her son who is also seen today to have his ears cleaned.  Past Medical History:  Diagnosis Date  . Hearing loss    Past Surgical History:  Procedure Laterality Date  . c-section    . lt. ear surgery    . obaha implant     for hearing loss  . TONSILLECTOMY     Social History   Socioeconomic History  . Marital status: Married    Spouse name: Not on file  . Number of children: Not on file  . Years of education: Not on file  . Highest education level: Not on file  Occupational History  . Not on file  Tobacco Use  . Smoking status: Never Smoker  . Smokeless tobacco: Never Used  Substance and Sexual Activity  . Alcohol use: No  . Drug use: No  . Sexual activity: Not on file  Other Topics Concern  . Not on file  Social History Narrative  . Not on file   Social Determinants of Health   Financial Resource Strain:   . Difficulty of Paying Living Expenses: Not on file  Food Insecurity:   . Worried About Charity fundraiser in the Last Year: Not on file  . Ran Out of Food in the Last Year: Not on file  Transportation Needs:   . Lack of Transportation (Medical): Not on file  . Lack of Transportation (Non-Medical): Not on file  Physical Activity:   . Days of Exercise per Week: Not on file  . Minutes of Exercise per Session: Not on file  Stress:   . Feeling of Stress : Not on file  Social Connections:   . Frequency of Communication with Friends and Family: Not on file  . Frequency of Social Gatherings with  Friends and Family: Not on file  . Attends Religious Services: Not on file  . Active Member of Clubs or Organizations: Not on file  . Attends Archivist Meetings: Not on file  . Marital Status: Not on file   Family History  Problem Relation Age of Onset  . Kidney cancer Sister   . Kidney Stones Sister   . Kidney Stones Brother   . Colon cancer Paternal Grandmother   . Colon polyps Paternal Grandmother   . Esophageal cancer Paternal Grandmother   . Rectal cancer Paternal Grandmother   . Stomach cancer Paternal Grandmother    Allergies  Allergen Reactions  . Other     metals  . Ciprofloxacin Rash   Prior to Admission medications   Medication Sig Start Date End Date Taking? Authorizing Provider  Cholecalciferol (VITAMIN D PO) Take 1 capsule by mouth 2 (two) times daily.    Yes [provider]     Positive ROS: Otherwise negative  All other systems have been reviewed and were otherwise negative with the exception of those mentioned in the HPI and as above.  Physical Exam: Constitutional: Alert, well-appearing, no  acute distress Ears: External ears without lesions or tenderness. Ear canals were cleaned bilaterally with suction and curettes.  Of note she had a large right TM perforation.  This is dry.  The left ear canal is small with white debris within the ear canal that was cleaned with suction.  Changes consistent with chronic mild left external otitis.  The TM appeared intact.  After cleaning the ear canal with suction I applied CSF powder to the left ear only. Nasal: External nose without lesions. Septum with minimal deformity.. Clear nasal passages.  No signs of infection Oral: Lips and gums without lesions. Tongue and palate mucosa without lesions. Posterior oropharynx is clear.  Indirect laryngoscopy revealed a clear base of tongue vallecula epiglottis.  Hypopharynx is clear.. Neck: No palpable adenopathy or masses Respiratory: Breathing comfortably   Skin: No facial/neck lesions or rash noted.  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   Radene Journey, MD

## 2019-06-12 ENCOUNTER — Encounter (INDEPENDENT_AMBULATORY_CARE_PROVIDER_SITE_OTHER): Payer: Self-pay | Admitting: Otolaryngology

## 2019-06-12 ENCOUNTER — Ambulatory Visit (INDEPENDENT_AMBULATORY_CARE_PROVIDER_SITE_OTHER): Payer: Medicare Other | Admitting: Otolaryngology

## 2019-06-12 ENCOUNTER — Other Ambulatory Visit: Payer: Self-pay

## 2019-06-12 VITALS — Temp 97.3°F

## 2019-06-12 DIAGNOSIS — H60312 Diffuse otitis externa, left ear: Secondary | ICD-10-CM

## 2019-06-12 DIAGNOSIS — H6123 Impacted cerumen, bilateral: Secondary | ICD-10-CM | POA: Diagnosis not present

## 2019-06-12 NOTE — Progress Notes (Signed)
HPI: Ashley Cline is a 64 y.o. female who returns today for evaluation of left ear.  She needs the left ear cleaned again.  It was previously cleaned about 2 months ago.  She is an old patient of Dr Thornell Mule.  She has had previous surgeries on the left ear x2.  She also had a Baha placed on the right side by Dr. Thornell Mule.  She has been having chronic problems with the left ear canal.  She has a hearing aid for the left ear but is unable to wear it because of the chronic buildup on the left side..  Past Medical History:  Diagnosis Date  . Hearing loss    Past Surgical History:  Procedure Laterality Date  . c-section    . lt. ear surgery    . obaha implant     for hearing loss  . TONSILLECTOMY     Social History   Socioeconomic History  . Marital status: Married    Spouse name: Not on file  . Number of children: Not on file  . Years of education: Not on file  . Highest education level: Not on file  Occupational History  . Not on file  Tobacco Use  . Smoking status: Never Smoker  . Smokeless tobacco: Never Used  Substance and Sexual Activity  . Alcohol use: No  . Drug use: No  . Sexual activity: Not on file  Other Topics Concern  . Not on file  Social History Narrative  . Not on file   Social Determinants of Health   Financial Resource Strain:   . Difficulty of Paying Living Expenses: Not on file  Food Insecurity:   . Worried About Charity fundraiser in the Last Year: Not on file  . Ran Out of Food in the Last Year: Not on file  Transportation Needs:   . Lack of Transportation (Medical): Not on file  . Lack of Transportation (Non-Medical): Not on file  Physical Activity:   . Days of Exercise per Week: Not on file  . Minutes of Exercise per Session: Not on file  Stress:   . Feeling of Stress : Not on file  Social Connections:   . Frequency of Communication with Friends and Family: Not on file  . Frequency of Social Gatherings with Friends and Family: Not on file  .  Attends Religious Services: Not on file  . Active Member of Clubs or Organizations: Not on file  . Attends Archivist Meetings: Not on file  . Marital Status: Not on file   Family History  Problem Relation Age of Onset  . Kidney cancer Sister   . Kidney Stones Sister   . Kidney Stones Brother   . Colon cancer Paternal Grandmother   . Colon polyps Paternal Grandmother   . Esophageal cancer Paternal Grandmother   . Rectal cancer Paternal Grandmother   . Stomach cancer Paternal Grandmother    Allergies  Allergen Reactions  . Other     metals  . Ciprofloxacin Rash   Prior to Admission medications   Medication Sig Start Date End Date Taking? Authorizing Provider  Cholecalciferol (VITAMIN D PO) Take 1 capsule by mouth 2 (two) times daily.    Yes [provider]     Positive ROS: Otherwise negative  All other systems have been reviewed and were otherwise negative with the exception of those mentioned in the HPI and as above.  Physical Exam: Constitutional: Alert, well-appearing, no acute distress Ears:  External ears without lesions or tenderness. Ear canals are small bilaterally.  She has normal wax in the right ear canal that was removed.  Ear canal and TM are otherwise clear.  On the left ear she has a slightly smaller opening of the ear canal.  The ear canal was cleaned with suction but there is a lot of buildup in the ear canal as well as adjacent to the TM with a very mild chronic external otitis.  After cleaning the ear canal I applied CSF powder to the left ear. Nasal: External nose without lesions. Septum relatively straight.. Clear nasal passages Oral: Lips and gums without lesions. Tongue and palate mucosa without lesions. Posterior oropharynx clear. Neck: No palpable adenopathy or masses Respiratory: Breathing comfortably  Skin: No facial/neck lesions or rash noted.  Cerumen impaction removal  Date/Time: 06/12/2019 3:29 PM Performed by: Rozetta Nunnery, MD Authorized by: Rozetta Nunnery, MD   Consent:    Consent obtained:  Verbal   Consent given by:  Patient   Risks discussed:  Pain and bleeding Procedure details:    Location:  L ear and R ear   Procedure type: curette and suction   Post-procedure details:    Inspection:  TM intact   Hearing quality:  Improved   Patient tolerance of procedure:  Tolerated well, no immediate complications Comments:     She has some mild chronic external otitis on the left side.  She has chronic buildup on the left side with a slightly small opening of the external auditory canal on the left.    Assessment: Mild chronic left external otitis with recurrent debris. Hearing loss and patient having difficulty wearing left hearing aid because of buildup on the left side.  Next left external ear canal stenosis  Plan: She would probably benefit by enlarging the left ear canal as this would provide easier access to cleaning the ear canal and perhaps better use of her hearing aid. Recommended obtaining previous operative reports on the left side by Dr. Thornell Mule that the patient will obtain. Next time she has problems with the left ear she will follow-up here for recheck and cleaning and discuss possible operative intervention if warranted.   Ashley Journey, MD

## 2019-10-12 ENCOUNTER — Encounter (INDEPENDENT_AMBULATORY_CARE_PROVIDER_SITE_OTHER): Payer: Self-pay | Admitting: Otolaryngology

## 2019-10-12 ENCOUNTER — Ambulatory Visit (INDEPENDENT_AMBULATORY_CARE_PROVIDER_SITE_OTHER): Payer: Medicare Other | Admitting: Otolaryngology

## 2019-10-12 ENCOUNTER — Other Ambulatory Visit: Payer: Self-pay

## 2019-10-12 VITALS — Temp 97.7°F

## 2019-10-12 DIAGNOSIS — H60312 Diffuse otitis externa, left ear: Secondary | ICD-10-CM

## 2019-10-12 DIAGNOSIS — H6123 Impacted cerumen, bilateral: Secondary | ICD-10-CM

## 2019-10-12 DIAGNOSIS — H7291 Unspecified perforation of tympanic membrane, right ear: Secondary | ICD-10-CM | POA: Diagnosis not present

## 2019-10-12 NOTE — Progress Notes (Signed)
HPI: Ashley Cline is a 64 y.o. female who returns today for evaluation of ear problems.  She feels like water is draining from the right ear she has used drops.  The left ear feels clogged and swollen.  She is a patient of Dr. Thornell Mule.  She has had a history of bilateral TM perforations for a number of years.  She has used eardrops when she has drainage from the ears.  She had a Baha implant in the right ear in 2018 by Dr. Thornell Mule.  She has small bilateral ear canals..  Past Medical History:  Diagnosis Date  . Hearing loss    Past Surgical History:  Procedure Laterality Date  . c-section    . lt. ear surgery    . obaha implant     for hearing loss  . TONSILLECTOMY     Social History   Socioeconomic History  . Marital status: Married    Spouse name: Not on file  . Number of children: Not on file  . Years of education: Not on file  . Highest education level: Not on file  Occupational History  . Not on file  Tobacco Use  . Smoking status: Never Smoker  . Smokeless tobacco: Never Used  Vaping Use  . Vaping Use: Never used  Substance and Sexual Activity  . Alcohol use: No  . Drug use: No  . Sexual activity: Not on file  Other Topics Concern  . Not on file  Social History Narrative  . Not on file   Social Determinants of Health   Financial Resource Strain:   . Difficulty of Paying Living Expenses:   Food Insecurity:   . Worried About Charity fundraiser in the Last Year:   . Arboriculturist in the Last Year:   Transportation Needs:   . Film/video editor (Medical):   Marland Kitchen Lack of Transportation (Non-Medical):   Physical Activity:   . Days of Exercise per Week:   . Minutes of Exercise per Session:   Stress:   . Feeling of Stress :   Social Connections:   . Frequency of Communication with Friends and Family:   . Frequency of Social Gatherings with Friends and Family:   . Attends Religious Services:   . Active Member of Clubs or Organizations:   . Attends Theatre manager Meetings:   Marland Kitchen Marital Status:    Family History  Problem Relation Age of Onset  . Kidney cancer Sister   . Kidney Stones Sister   . Kidney Stones Brother   . Colon cancer Paternal Grandmother   . Colon polyps Paternal Grandmother   . Esophageal cancer Paternal Grandmother   . Rectal cancer Paternal Grandmother   . Stomach cancer Paternal Grandmother    Allergies  Allergen Reactions  . Other     metals  . Ciprofloxacin Rash   Prior to Admission medications   Medication Sig Start Date End Date Taking? Authorizing Provider  Cholecalciferol (VITAMIN D PO) Take 1 capsule by mouth 2 (two) times daily.    Yes [provider]     Positive ROS: Otherwise negative  All other systems have been reviewed and were otherwise negative with the exception of those mentioned in the HPI and as above.  Physical Exam: Constitutional: Alert, well-appearing, no acute distress Ears: External ears without lesions or tenderness.  Ear canals are small bilaterally.  They were both cleaned using suction.  On the right side she has a  chronic large right TM perforation with a small amount of drainage.  After cleaning the ear canal I applied CSF powder to the right ear.  On the left side she had slightly more swelling and inflamed mucosa and after cleaning the left ear I applied gentian violet and CSF powder to the left ear.  Did not appreciate an obvious perforation on the left side in the office today.  She will follow-up as needed any further problems.  At some point may need the left ear canal enlarged as she has had chronic problems on the left side. Nasal: External nose without lesions.. Clear nasal passages Oral: Lips and gums without lesions. Tongue and palate mucosa without lesions. Posterior oropharynx clear. Neck: No palpable adenopathy or masses Respiratory: Breathing comfortably  Skin: No facial/neck lesions or rash noted.  Cerumen impaction removal  Date/Time: 10/12/2019  7:21 PM Performed by: Rozetta Nunnery, MD Authorized by: Rozetta Nunnery, MD   Consent:    Consent obtained:  Verbal   Consent given by:  Patient   Risks discussed:  Pain and bleeding Procedure details:    Location:  L ear and R ear   Procedure type: curette and suction   Post-procedure details:    Inspection:  Canal normal   Hearing quality:  Improved   Patient tolerance of procedure:  Tolerated well, no immediate complications Comments:     Left ear canal slightly inflamed and slightly swollen.  After cleaning the ear canal I applied gentian violet and CSF powder to the left ear.  On the right side she has a little bit of drainage with a chronic right TM perforation and a Baha implant behind the right ear.    Assessment: Patient with chronic right TM perforation with intermittent drainage. Small left ear canal with a large amount of debris that was cleaned in the office today.  Plan: Refilled her antibiotic eardrops. She will follow-up as needed.   Radene Journey, MD

## 2020-01-19 ENCOUNTER — Ambulatory Visit: Payer: Medicare Other | Admitting: Psychology

## 2020-02-02 ENCOUNTER — Ambulatory Visit: Payer: BLUE CROSS/BLUE SHIELD | Admitting: Psychology

## 2020-02-16 ENCOUNTER — Ambulatory Visit: Payer: BLUE CROSS/BLUE SHIELD | Admitting: Psychology

## 2020-03-22 ENCOUNTER — Other Ambulatory Visit: Payer: Self-pay | Admitting: Family Medicine

## 2020-03-22 DIAGNOSIS — N644 Mastodynia: Secondary | ICD-10-CM

## 2020-03-23 ENCOUNTER — Ambulatory Visit
Admission: RE | Admit: 2020-03-23 | Discharge: 2020-03-23 | Disposition: A | Discharge status: Home / Self Care | Payer: Medicare Other | Source: Ambulatory Visit | Attending: Family Medicine | Admitting: Family Medicine

## 2020-03-23 ENCOUNTER — Other Ambulatory Visit: Payer: Self-pay

## 2020-03-23 DIAGNOSIS — N644 Mastodynia: Secondary | ICD-10-CM

## 2020-06-01 ENCOUNTER — Encounter (INDEPENDENT_AMBULATORY_CARE_PROVIDER_SITE_OTHER): Payer: Self-pay | Admitting: Otolaryngology

## 2020-06-01 ENCOUNTER — Other Ambulatory Visit: Payer: Self-pay

## 2020-06-01 ENCOUNTER — Ambulatory Visit (INDEPENDENT_AMBULATORY_CARE_PROVIDER_SITE_OTHER): Payer: Medicare Other | Admitting: Otolaryngology

## 2020-06-01 VITALS — Temp 97.2°F

## 2020-06-01 DIAGNOSIS — H60312 Diffuse otitis externa, left ear: Secondary | ICD-10-CM | POA: Diagnosis not present

## 2020-06-01 DIAGNOSIS — H6123 Impacted cerumen, bilateral: Secondary | ICD-10-CM | POA: Diagnosis not present

## 2020-06-01 NOTE — Progress Notes (Signed)
HPI: Ashley Cline is a 65 y.o. female who returns today for evaluation of of her ears predominantly the left ear.  She is a patient of Dr. Thornell Mule and has had previous surgery with him including a right Baha placed several years ago.  She has a hearing aid for the left ear but has a lot of problems with chronic drainage from the left ear who presents today because of blockage and drainage from the left ear.  Past Medical History:  Diagnosis Date  . Hearing loss    Past Surgical History:  Procedure Laterality Date  . c-section    . lt. ear surgery    . obaha implant     for hearing loss  . TONSILLECTOMY     Social History   Socioeconomic History  . Marital status: Married    Spouse name: Not on file  . Number of children: Not on file  . Years of education: Not on file  . Highest education level: Not on file  Occupational History  . Not on file  Tobacco Use  . Smoking status: Never Smoker  . Smokeless tobacco: Never Used  Vaping Use  . Vaping Use: Never used  Substance and Sexual Activity  . Alcohol use: No  . Drug use: No  . Sexual activity: Not on file  Other Topics Concern  . Not on file  Social History Narrative  . Not on file   Social Determinants of Health   Financial Resource Strain: Not on file  Food Insecurity: Not on file  Transportation Needs: Not on file  Physical Activity: Not on file  Stress: Not on file  Social Connections: Not on file   Family History  Problem Relation Age of Onset  . Kidney cancer Sister   . Kidney Stones Sister   . Kidney Stones Brother   . Colon cancer Paternal Grandmother   . Colon polyps Paternal Grandmother   . Esophageal cancer Paternal Grandmother   . Rectal cancer Paternal Grandmother   . Stomach cancer Paternal Grandmother    Allergies  Allergen Reactions  . Other     metals  . Ciprofloxacin Rash   Prior to Admission medications   Medication Sig Start Date End Date Taking? Authorizing Provider   Cholecalciferol (VITAMIN D PO) Take 1 capsule by mouth 2 (two) times daily.     [provider]     Positive ROS: Otherwise negative  All other systems have been reviewed and were otherwise negative with the exception of those mentioned in the HPI and as above.  Physical Exam: Constitutional: Alert, well-appearing, no acute distress Ears: External ears without lesions or tenderness.  Right ear canal is clear except for minimal wax buildup that was removed with a curette.  There is no drainage or inflammatory changes.  The left ear canal has a large amount of drainage that was cleaned with suction.  Of note she has a stenotic left ear canal.  After cleaning the ear canal the TM appeared intact with no obvious drainage or perforation noted.  She has had a previous history of TM perforation.  After cleaning the ear canal I applied gentian violet and CSF powder to the left ear only. Nasal: External nose without lesions. Clear nasal passages Oral: Lips and gums without lesions. Tongue and palate mucosa without lesions. Posterior oropharynx clear. Neck: No palpable adenopathy or masses Respiratory: Breathing comfortably  Skin: No facial/neck lesions or rash noted.  Cerumen impaction removal  Date/Time: 06/01/2020 1:21  PM Performed by: Rozetta Nunnery, MD Authorized by: Rozetta Nunnery, MD   Consent:    Consent obtained:  Verbal   Consent given by:  Patient   Risks discussed:  Pain and bleeding Procedure details:    Location:  L ear and R ear   Procedure type: curette and suction   Post-procedure details:    Inspection:  TM intact and canal normal   Hearing quality:  Improved   Patient tolerance of procedure:  Tolerated well, no immediate complications Comments:     Patient with a small left ear canal.  She had a purulent discharge within the ear canal that was cleaned with suction and the ear canal was irrigated with peroxide and alcohol.  After cleaning the ear  canal I applied gentian violet, Floxin and CSF powder.  There was no obvious TM perforation.  And no active drainage noted.  Right ear canal had minimal wax that was cleaned with curette and the right ear canal was otherwise clear with no signs of infection.  She wears a Baha on the right side.    Assessment: Left ear external otitis with drainage and debris that was cleaned in the office.  Plan: Ear canal was cleaned and applied gentian violet, Ciprodex and CSF powder.  Recommend keeping the ear dry and not wearing her hearing aid for the next 5 days. She has a scheduled follow-up appointment with Dr.Kraus.   Radene Journey, MD

## 2020-06-09 DIAGNOSIS — Z9621 Cochlear implant status: Secondary | ICD-10-CM | POA: Insufficient documentation

## 2020-06-09 DIAGNOSIS — H7201 Central perforation of tympanic membrane, right ear: Secondary | ICD-10-CM | POA: Insufficient documentation

## 2020-06-09 DIAGNOSIS — H61302 Acquired stenosis of left external ear canal, unspecified: Secondary | ICD-10-CM | POA: Insufficient documentation

## 2020-06-09 DIAGNOSIS — H90A31 Mixed conductive and sensorineural hearing loss, unilateral, right ear with restricted hearing on the contralateral side: Secondary | ICD-10-CM | POA: Insufficient documentation

## 2020-06-09 DIAGNOSIS — Z9889 Other specified postprocedural states: Secondary | ICD-10-CM | POA: Insufficient documentation

## 2020-07-05 ENCOUNTER — Telehealth (INDEPENDENT_AMBULATORY_CARE_PROVIDER_SITE_OTHER): Payer: Self-pay | Admitting: Otolaryngology

## 2020-07-05 ENCOUNTER — Other Ambulatory Visit (INDEPENDENT_AMBULATORY_CARE_PROVIDER_SITE_OTHER): Payer: Self-pay | Admitting: Otolaryngology

## 2020-07-05 MED ORDER — TOBRAMYCIN-DEXAMETHASONE 0.3-0.1 % OP SUSP: 4.0000 [drp] | Freq: Three times a day (TID) | OPHTHALMIC | 0 refills | Status: AC

## 2020-07-05 MED ORDER — AMOXICILLIN-POT CLAVULANATE 875-125 MG PO TABS: 1.0000 | ORAL_TABLET | Freq: Two times a day (BID) | ORAL | 0 refills | Status: DC

## 2020-07-05 NOTE — Telephone Encounter (Signed)
Patient called about yellow drainage from her right ear that started yesterday and she is getting ready to fly out of the country in 2 days. She has a right TM perforation and was last seen by Dr Thornell Mule. Called in antibiotic ear drop Tobradex for the right ear 4 gtts tid ( she has used these previously as she's allergic to Ciprodex) and also Augmentin. It is OK for her to fly.

## 2021-02-27 ENCOUNTER — Other Ambulatory Visit: Payer: Self-pay | Admitting: Family Medicine

## 2021-02-27 DIAGNOSIS — Z1231 Encounter for screening mammogram for malignant neoplasm of breast: Secondary | ICD-10-CM

## 2021-04-04 ENCOUNTER — Ambulatory Visit
Admission: RE | Admit: 2021-04-04 | Discharge: 2021-04-04 | Disposition: A | Discharge status: Home / Self Care | Payer: Medicare Other | Source: Ambulatory Visit | Attending: Family Medicine | Admitting: Family Medicine

## 2021-04-04 DIAGNOSIS — Z1231 Encounter for screening mammogram for malignant neoplasm of breast: Secondary | ICD-10-CM

## 2021-04-07 DIAGNOSIS — H6993 Unspecified Eustachian tube disorder, bilateral: Secondary | ICD-10-CM | POA: Insufficient documentation

## 2021-09-07 DIAGNOSIS — H60542 Acute eczematoid otitis externa, left ear: Secondary | ICD-10-CM | POA: Insufficient documentation

## 2022-02-27 ENCOUNTER — Encounter: Payer: Self-pay | Admitting: Gastroenterology

## 2022-03-05 DIAGNOSIS — H918X3 Other specified hearing loss, bilateral: Secondary | ICD-10-CM | POA: Insufficient documentation

## 2022-03-19 ENCOUNTER — Other Ambulatory Visit: Payer: Self-pay | Admitting: Internal Medicine

## 2022-03-19 DIAGNOSIS — Z1231 Encounter for screening mammogram for malignant neoplasm of breast: Secondary | ICD-10-CM

## 2022-03-30 ENCOUNTER — Ambulatory Visit (AMBULATORY_SURGERY_CENTER): Payer: Medicare Other | Admitting: *Deleted

## 2022-03-30 VITALS — Ht 62.0 in | Wt 136.6 lb

## 2022-03-30 DIAGNOSIS — Z86010 Personal history of colonic polyps: Secondary | ICD-10-CM

## 2022-03-30 MED ORDER — NA SULFATE-K SULFATE-MG SULF 17.5-3.13-1.6 GM/177ML PO SOLN: 1.0000 | Freq: Once | ORAL | 0 refills | Status: AC

## 2022-03-30 NOTE — Progress Notes (Signed)
No egg or soy allergy known to patient  No issues known to pt with past sedation with any surgeries or procedures Patient denies ever being told they had issues or difficulty with intubation  No FH of Malignant Hyperthermia Pt is not on diet pills Pt is not on  home 02  Pt is not on blood thinners  Pt issues with constipation ,DOUBLE PREP No A fib or A flutter Have any cardiac testing pending--NO Pt instructed to use Singlecare.com or GoodRx for a price reduction on prep    Patient's chart reviewed by Osvaldo Angst CNRA prior to previsit and patient appropriate for the [facility].  Previsit completed and red dot placed by patient's name on their procedure day (on provider's schedule).

## 2022-04-16 ENCOUNTER — Ambulatory Visit (INDEPENDENT_AMBULATORY_CARE_PROVIDER_SITE_OTHER): Payer: Medicare Other | Admitting: Podiatry

## 2022-04-16 ENCOUNTER — Encounter: Payer: Self-pay | Admitting: Podiatry

## 2022-04-16 DIAGNOSIS — H90A22 Sensorineural hearing loss, unilateral, left ear, with restricted hearing on the contralateral side: Secondary | ICD-10-CM | POA: Insufficient documentation

## 2022-04-16 DIAGNOSIS — L602 Onychogryphosis: Secondary | ICD-10-CM

## 2022-04-16 NOTE — Progress Notes (Signed)
  Subjective:  Patient ID: Ashley Cline, female    DOB: 11-28-1955,  MRN: [id number]  Chief Complaint  Patient presents with   Nail Problem    np left great toe injured and old nail black and new nail growing    66 y.o. female presents with the above complaint. History confirmed with patient.  She has previously damaged the nail she thinks she dropped something on it and it grew back and try to grow a new nail and the new nail was attached to the old nail and is gotten thicker and thicker and is causing pain now  Objective:  Physical Exam: warm, good capillary refill, no trophic changes or ulcerative lesions, normal DP and PT pulses, normal sensory exam, and gryphotic left hallux nail with lamellar appearance.  Assessment:   1. Onychogryphosis      Plan:  Patient was evaluated and treated and all questions answered.  Discussed treatment of her significant nail deformity.  We discussed permanent versus temporary removal and matricectomy.  I recommended a temporary removal and see if the old nail removal will allow the new nail to grow in uneventfully.  We discussed the possibility of permanent dystrophy and possibility that the nail may not regrow at all.  We also discussed permanent matricectomy if this happens.  She agreed to proceed today, following verbal consent and digital block with 1.5 cc of 2% lidocaine and 0.5% Marcaine plain the left hallux nail was avulsed in toto, no matricectomy was performed today.  She will return to see me as needed if it gives her further issues  Return if symptoms worsen or fail to improve.

## 2022-04-16 NOTE — Patient Instructions (Signed)

## 2022-04-20 ENCOUNTER — Encounter: Payer: Self-pay | Admitting: Gastroenterology

## 2022-04-20 ENCOUNTER — Ambulatory Visit (AMBULATORY_SURGERY_CENTER): Payer: Medicare Other | Admitting: Gastroenterology

## 2022-04-20 VITALS — BP 117/74 | HR 60 | Temp 98.9°F | Resp 11 | Ht 62.0 in | Wt 136.6 lb

## 2022-04-20 DIAGNOSIS — D122 Benign neoplasm of ascending colon: Secondary | ICD-10-CM | POA: Diagnosis not present

## 2022-04-20 DIAGNOSIS — Z09 Encounter for follow-up examination after completed treatment for conditions other than malignant neoplasm: Secondary | ICD-10-CM

## 2022-04-20 DIAGNOSIS — Z86010 Personal history of colonic polyps: Secondary | ICD-10-CM

## 2022-04-20 MED ORDER — SODIUM CHLORIDE 0.9 % IV SOLN: 500.0000 mL | INTRAVENOUS | Status: DC

## 2022-04-20 NOTE — Progress Notes (Signed)
Called to room to assist during endoscopic procedure.  Patient ID and intended procedure confirmed with present staff. Received instructions for my participation in the procedure from the performing physician.  

## 2022-04-20 NOTE — Progress Notes (Signed)
Report to PACU, RN, vss, BBS= Clear.  

## 2022-04-20 NOTE — Op Note (Signed)
[place] Patient Name: Judaea Burgoon Procedure Date: 04/20/2022 9:38 AM MRN: [id number] Endoscopist: Mauri Pole , MD, [id number] Age: 66 Referring MD:  Date of Birth: [birth year] Gender: Female Account #: [id number] Procedure:                Colonoscopy Indications:              High risk colon cancer surveillance: Personal                            history of colonic polyps, High risk colon cancer                            surveillance: Personal history of adenoma less than                            10 mm in size Medicines:                Monitored Anesthesia Care Procedure:                Pre-Anesthesia Assessment:                           - Prior to the procedure, a History and Physical                            was performed, and patient medications and                            allergies were reviewed. The patient's tolerance of                            previous anesthesia was also reviewed. The risks                            and benefits of the procedure and the sedation                            options and risks were discussed with the patient.                            All questions were answered, and informed consent                            was obtained. Prior Anticoagulants: The patient has                            taken no anticoagulant or antiplatelet agents. ASA                            Grade Assessment: II - A patient with mild systemic                            disease. After reviewing the risks and benefits,  the patient was deemed in satisfactory condition to                            undergo the procedure.                           After obtaining informed consent, the colonoscope                            was passed under direct vision. Throughout the                            procedure, the patient's blood pressure, pulse, and                            oxygen saturations were monitored  continuously. The                            Olympus PCF-H190DL ([id number]) Colonoscope was                            introduced through the anus and advanced to the the                            cecum, identified by appendiceal orifice and                            ileocecal valve. The colonoscopy was performed                            without difficulty. The patient tolerated the                            procedure well. The quality of the bowel                            preparation was good. The ileocecal valve,                            appendiceal orifice, and rectum were photographed. Scope In: 9:50:23 AM Scope Out: 10:13:02 AM Scope Withdrawal Time: 0 hours 13 minutes 16 seconds  Total Procedure Duration: 0 hours 22 minutes 39 seconds  Findings:                 The perianal and digital rectal examinations were                            normal.                           A 4 mm polyp was found in the ascending colon. The                            polyp was sessile. The polyp was removed with a  cold snare. Resection and retrieval were complete.                           Scattered small-mouthed diverticula were found in                            the sigmoid colon and descending colon.                           Non-bleeding internal hemorrhoids were found during                            retroflexion. The hemorrhoids were small. Complications:            No immediate complications. Estimated Blood Loss:     Estimated blood loss was minimal. Impression:               - One 4 mm polyp in the ascending colon, removed                            with a cold snare. Resected and retrieved.                           - Diverticulosis in the sigmoid colon and in the                            descending colon.                           - Non-bleeding internal hemorrhoids. Recommendation:           - Patient has a contact number available for                             emergencies. The signs and symptoms of potential                            delayed complications were discussed with the                            patient. Return to normal activities tomorrow.                            Written discharge instructions were provided to the                            patient.                           - Resume previous diet.                           - Continue present medications.                           - Await pathology results.                           -  Repeat colonoscopy in 5-10 years for surveillance                            based on pathology results.                           - Use Benefiber one teaspoon PO TID. Mauri Pole, MD 04/20/2022 10:20:25 AM This report has been signed electronically.

## 2022-04-20 NOTE — Progress Notes (Signed)
[facility] History and Physical   Primary Care Physician:  Shon Baton, MD   Reason for Procedure:  History of adenomatous colon polyps  Plan:    Surveillance colonoscopy with possible interventions as needed     HPI: Ashley Cline is a very pleasant 66 y.o. female here for surveillance colonoscopy. Denies any nausea, vomiting, abdominal pain, melena or bright red blood per rectum  The risks and benefits as well as alternatives of endoscopic procedure(s) have been discussed and reviewed. All questions answered. The patient agrees to proceed.    Past Medical History:  Diagnosis Date   Hearing loss    Migraines     Past Surgical History:  Procedure Laterality Date   c-section     lt. ear surgery     obaha implant     for hearing loss   TONSILLECTOMY     AS A CHILD   WRISIT Left    BROKEN    Prior to Admission medications   Medication Sig Start Date End Date Taking? Authorizing Provider  amLODipine (NORVASC) 5 MG tablet Take by mouth.   Yes [provider]  Cholecalciferol 50 MCG (2000 UT) CAPS Take 1 capsule by mouth daily.   Yes [provider]  hydrocortisone 2.5 % ointment Apply topically. 03/25/20  Yes [provider]  Na Sulfate-K Sulfate-Mg Sulf 17.5-3.13-1.6 GM/177ML SOLN SMARTSIG:1 Kit(s) By Mouth Once 03/30/22  Yes [provider]  tobramycin-dexamethasone (TOBRADEX) ophthalmic solution Place 4 drops into the right eye in the morning, at noon, and at bedtime. 07/05/20  Yes Rozetta Nunnery, MD  olmesartan (BENICAR) 20 MG tablet 1 tablet Orally Once a day for 90 days Patient not taking: Reported on 04/20/2022    [provider]    Current Outpatient Medications  Medication Sig Dispense Refill   amLODipine (NORVASC) 5 MG tablet Take by mouth.     Cholecalciferol 50 MCG (2000 UT) CAPS Take 1 capsule by mouth daily.     hydrocortisone 2.5 % ointment Apply topically.     Na Sulfate-K Sulfate-Mg Sulf  17.5-3.13-1.6 GM/177ML SOLN SMARTSIG:1 Kit(s) By Mouth Once     tobramycin-dexamethasone (TOBRADEX) ophthalmic solution Place 4 drops into the right eye in the morning, at noon, and at bedtime. 5 mL 0   olmesartan (BENICAR) 20 MG tablet 1 tablet Orally Once a day for 90 days (Patient not taking: Reported on 04/20/2022)     Current Facility-Administered Medications  Medication Dose Route Frequency Provider Last Rate Last Admin   0.9 %  sodium chloride infusion  500 mL Intravenous Continuous Malea Swilling, Venia Minks, MD        Allergies as of 04/20/2022 - Review Complete 04/20/2022  Allergen Reaction Noted   Other  03/29/2017   Ciprofloxacin Rash 02/16/2017    Family History  Problem Relation Age of Onset   Kidney cancer Sister    Kidney Stones Sister    Kidney Stones Brother    Colon cancer Neg Hx    Colon polyps Neg Hx    Crohn's disease Neg Hx    Esophageal cancer Neg Hx    Rectal cancer Neg Hx    Stomach cancer Neg Hx    Ulcerative colitis Neg Hx     Social History   Socioeconomic History   Marital status: Married    Spouse name: Not on file   Number of children: Not on file   Years of education: Not on file   Highest education level: Not on file  Occupational History   Not on file  Tobacco Use   Smoking status: Never    Passive exposure: Past (SON SMOKED)   Smokeless tobacco: Never  Vaping Use   Vaping Use: Never used  Substance and Sexual Activity   Alcohol use: No   Drug use: No   Sexual activity: Not on file  Other Topics Concern   Not on file  Social History Narrative   Not on file   Social Determinants of Health   Financial Resource Strain: Not on file  Food Insecurity: Not on file  Transportation Needs: Not on file  Physical Activity: Not on file  Stress: Not on file  Social Connections: Not on file  Intimate Partner Violence: Not on file    Review of Systems:  All other review of systems negative except as mentioned in the HPI.  Physical  Exam: Vital signs in last 24 hours: Blood Pressure 124/83   Pulse 71   Temperature 98.9 F (37.2 C) (Temporal)   Height _0  (1.575 m)   Weight 136 lb 9.6 oz (62 kg)   Oxygen Saturation 97%   Body Mass Index 24.98 kg/m  General:   Alert, NAD Lungs:  Clear .   Heart:  Regular rate and rhythm Abdomen:  Soft, nontender and nondistended. Neuro/Psych:  Alert and cooperative. Normal mood and affect. A and O x 3  Reviewed labs, radiology imaging, old records and pertinent past GI work up  Patient is appropriate for planned procedure(s) and anesthesia in an ambulatory setting   K. Denzil Magnuson , MD [phone]

## 2022-04-20 NOTE — Progress Notes (Signed)
Pt's states no medical or surgical changes since previsit or office visit. 

## 2022-04-20 NOTE — Patient Instructions (Signed)
Handouts on polyps, hemorrhoids, and diverticulosis given to patient.  Await pathology results. Resume previous diet and continue present medications - recommended to use Benefiber 1 teaspoon three times a day  Repeat colonoscopy in 5-10 years for surveillance based off of pathology results.  YOU HAD AN ENDOSCOPIC PROCEDURE TODAY AT [facility]:   Refer to the procedure report that was given to you for any specific questions about what was found during the examination.  If the procedure report does not answer your questions, please call your gastroenterologist to clarify.  If you requested that your care partner not be given the details of your procedure findings, then the procedure report has been included in a sealed envelope for you to review at your convenience later.  YOU SHOULD EXPECT: Some feelings of bloating in the abdomen. Passage of more gas than usual.  Walking can help get rid of the air that was put into your GI tract during the procedure and reduce the bloating. If you had a lower endoscopy (such as a colonoscopy or flexible sigmoidoscopy) you may notice spotting of blood in your stool or on the toilet paper. If you underwent a bowel prep for your procedure, you may not have a normal bowel movement for a few days.  Please Note:  You might notice some irritation and congestion in your nose or some drainage.  This is from the oxygen used during your procedure.  There is no need for concern and it should clear up in a day or so.  SYMPTOMS TO REPORT IMMEDIATELY:  Following lower endoscopy (colonoscopy or flexible sigmoidoscopy):  Excessive amounts of blood in the stool  Significant tenderness or worsening of abdominal pains  Swelling of the abdomen that is new, acute  Fever of 100F or higher  For urgent or emergent issues, a gastroenterologist can be reached at any hour by calling [phone]. Do not use MyChart messaging for urgent concerns.    DIET:  We do  recommend a small meal at first, but then you may proceed to your regular diet.  Drink plenty of fluids but you should avoid alcoholic beverages for 24 hours.  ACTIVITY:  You should plan to take it easy for the rest of today and you should NOT DRIVE or use heavy machinery until tomorrow (because of the sedation medicines used during the test).    FOLLOW UP: Our staff will call the number listed on your records the next business day following your procedure.  We will call around 7:15- 8:00 am to check on you and address any questions or concerns that you may have regarding the information given to you following your procedure. If we do not reach you, we will leave a message.     If any biopsies were taken you will be contacted by phone or by letter within the next 1-3 weeks.  Please call us at [phone] if you have not heard about the biopsies in 3 weeks.    SIGNATURES/CONFIDENTIALITY: You and/or your care partner have signed paperwork which will be entered into your electronic medical record.  These signatures attest to the fact that that the information above on your After Visit Summary has been reviewed and is understood.  Full responsibility of the confidentiality of this discharge information lies with you and/or your care-partner.

## 2022-04-25 ENCOUNTER — Telehealth: Payer: Self-pay

## 2022-04-25 NOTE — Telephone Encounter (Signed)
Attempted to reach patient for post-procedure f/u call. No answer. Left message for her to please not hesitate to call us if she has any questions/concerns regarding her care. 

## 2022-05-03 ENCOUNTER — Encounter: Payer: Self-pay | Admitting: Gastroenterology

## 2022-05-10 ENCOUNTER — Ambulatory Visit
Admission: RE | Admit: 2022-05-10 | Discharge: 2022-05-10 | Disposition: A | Discharge status: Home / Self Care | Payer: Medicare Other | Source: Ambulatory Visit | Attending: Internal Medicine | Admitting: Internal Medicine

## 2022-05-10 DIAGNOSIS — Z1231 Encounter for screening mammogram for malignant neoplasm of breast: Secondary | ICD-10-CM

## 2023-01-02 IMAGING — MG MM DIGITAL SCREENING BILAT W/ TOMO AND CAD
6 of 10 series · 6 of 30 positions shown · non-contrast
Comparison: Previous exam(s).

CLINICAL DATA: Screening.

EXAM:
DIGITAL SCREENING BILATERAL MAMMOGRAM WITH TOMOSYNTHESIS AND CAD
TECHNIQUE: Bilateral screening digital craniocaudal and mediolateral oblique
mammograms were obtained. Bilateral screening digital breast
tomosynthesis was performed. The images were evaluated with
computer-aided detection.

[L MLO synth-2D]
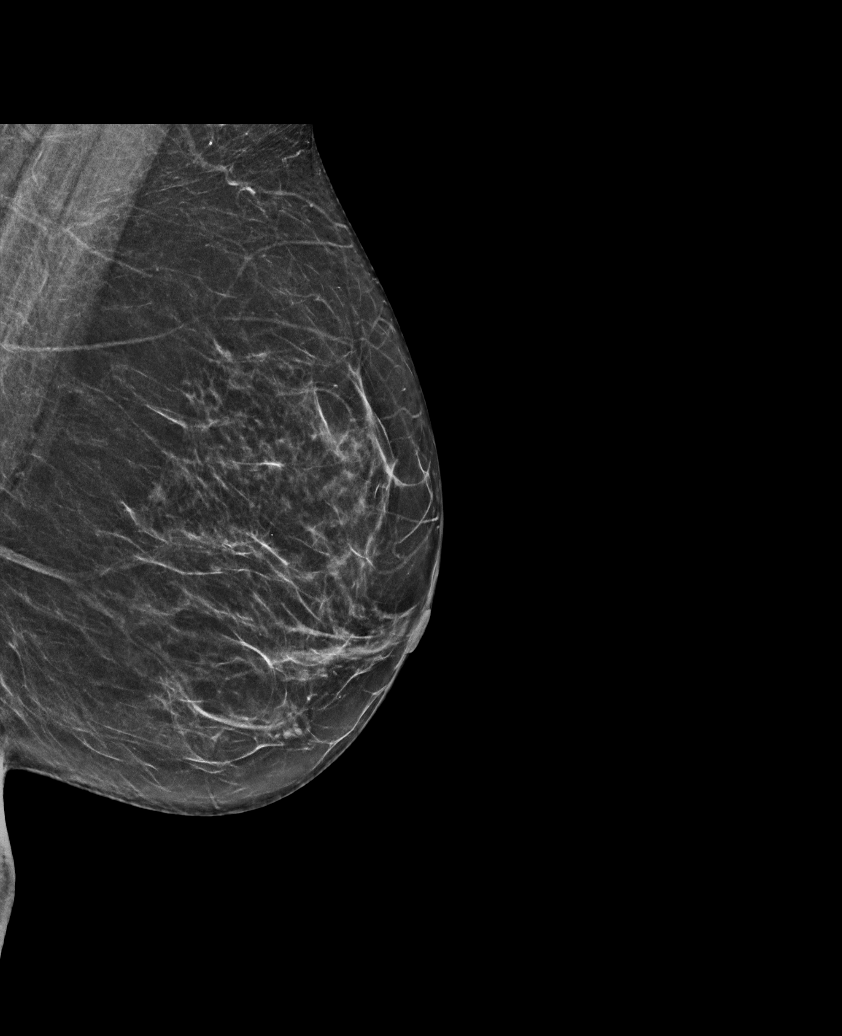

[R XCCL synth-2D]
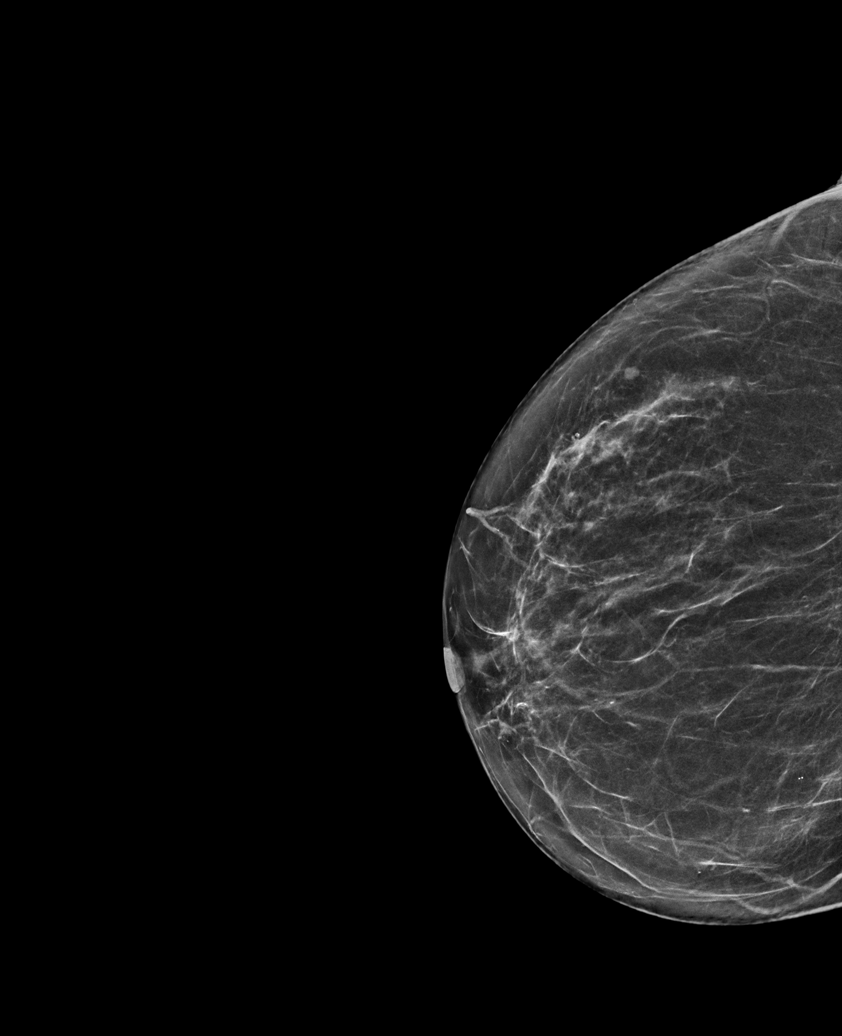

[R MLO synth-2D]
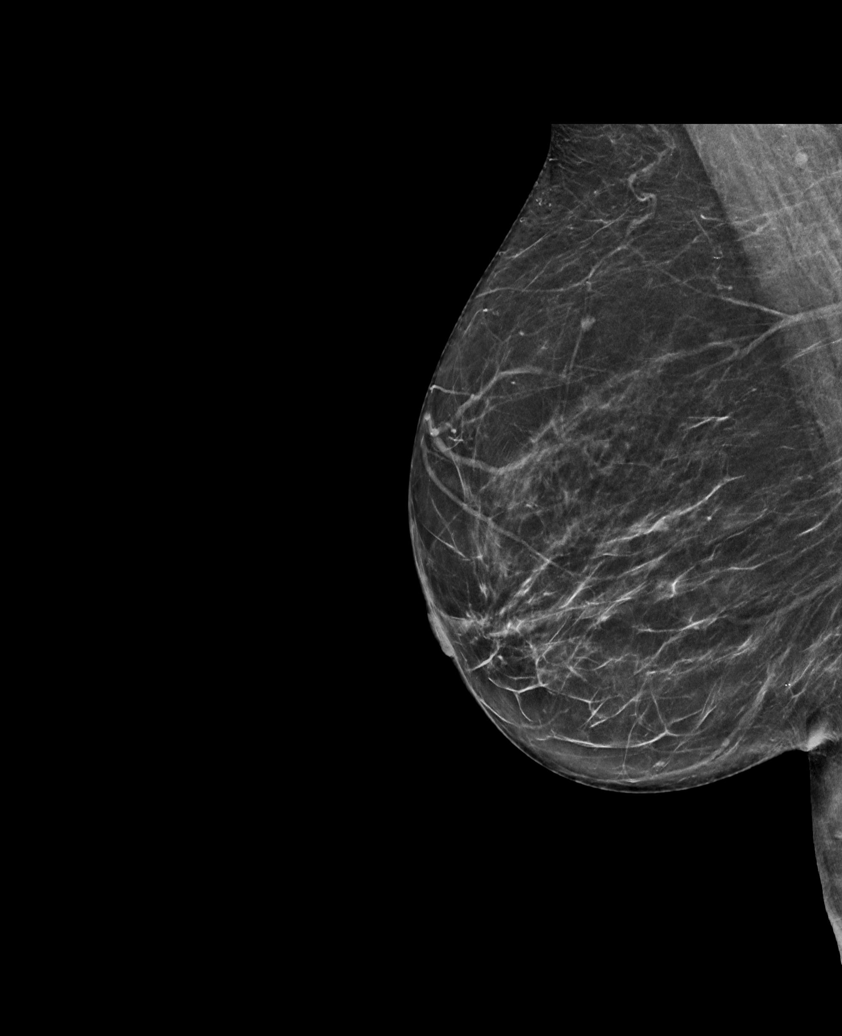

[L CC synth-2D]
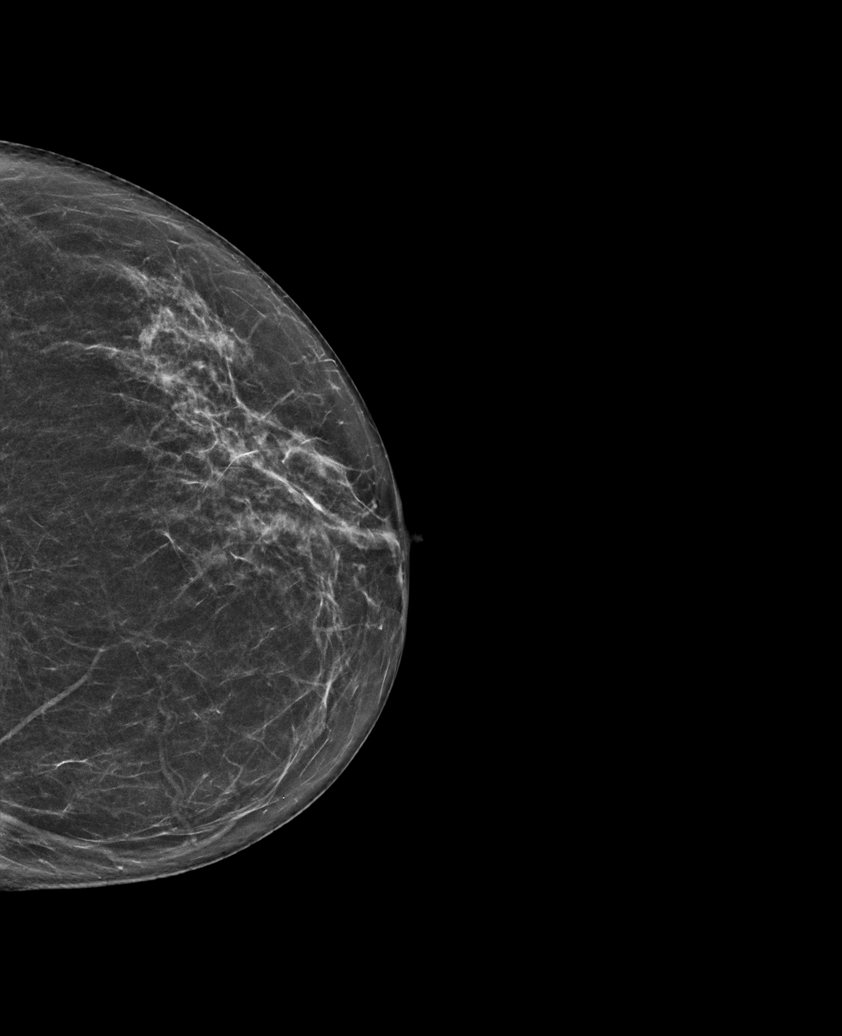

[R CC synth-2D]
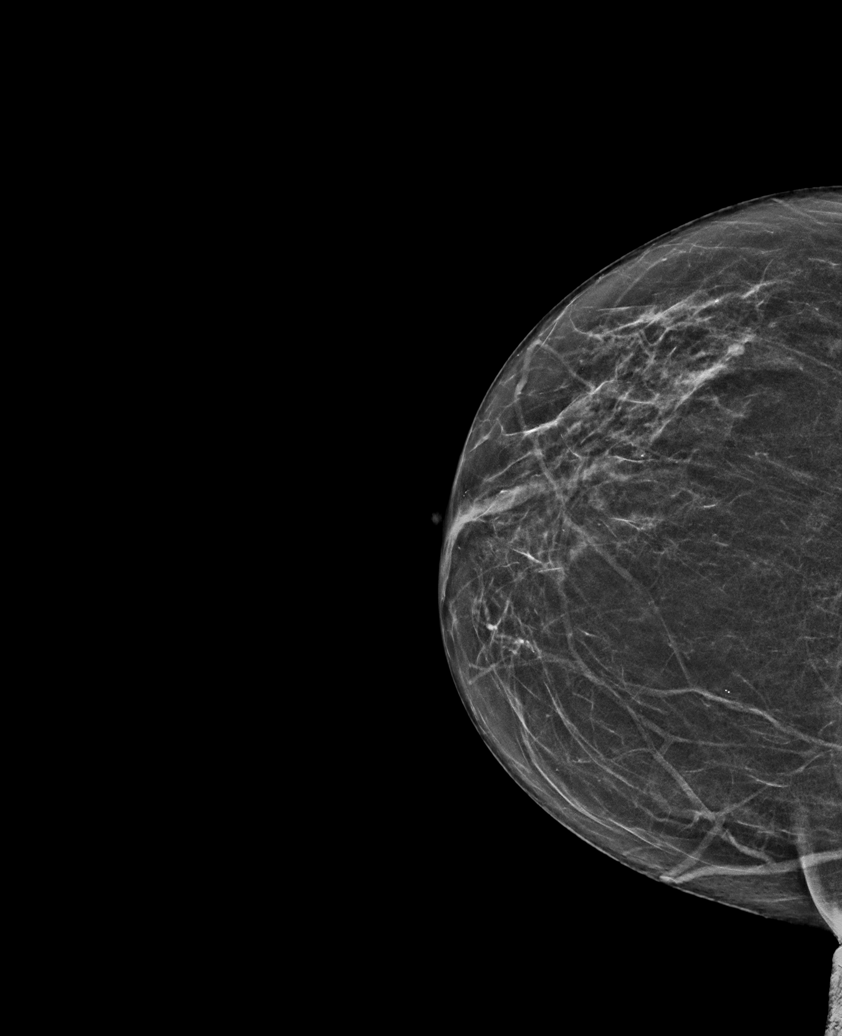

[L MLO tomo · tomo slice 33/65.0]
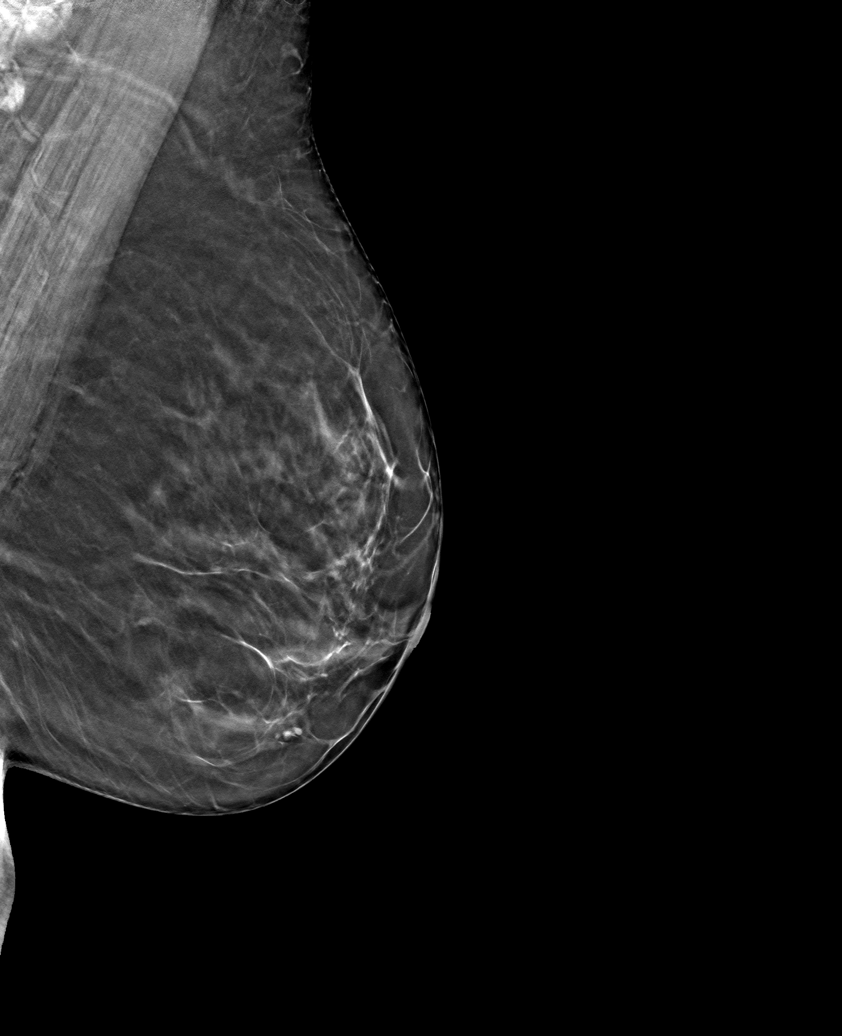

[6 of 30 positions shown; findings below may reference images not displayed]

ACR Breast Density Category b: There are scattered areas of
fibroglandular density.
FINDINGS: There are no findings suspicious for malignancy.
IMPRESSION: No mammographic evidence of malignancy. A result letter of this
screening mammogram will be mailed directly to the patient.

RECOMMENDATION:
Screening mammogram in one year. (Code:51-O-LD2)

BI-RADS CATEGORY  1: Negative.

## 2023-02-06 ENCOUNTER — Ambulatory Visit: Payer: Medicare Other | Attending: Cardiology | Admitting: Cardiology

## 2023-02-06 ENCOUNTER — Encounter: Payer: Self-pay | Admitting: Cardiology

## 2023-02-06 VITALS — BP 100/64 | HR 84 | Ht 62.0 in | Wt 133.4 lb

## 2023-02-06 DIAGNOSIS — R0609 Other forms of dyspnea: Secondary | ICD-10-CM | POA: Insufficient documentation

## 2023-02-06 DIAGNOSIS — R0789 Other chest pain: Secondary | ICD-10-CM | POA: Insufficient documentation

## 2023-02-06 DIAGNOSIS — Z01810 Encounter for preprocedural cardiovascular examination: Secondary | ICD-10-CM | POA: Diagnosis present

## 2023-02-06 DIAGNOSIS — R072 Precordial pain: Secondary | ICD-10-CM | POA: Insufficient documentation

## 2023-02-06 DIAGNOSIS — R079 Chest pain, unspecified: Secondary | ICD-10-CM | POA: Diagnosis not present

## 2023-02-06 MED ORDER — METOPROLOL TARTRATE 100 MG PO TABS
ORAL_TABLET | ORAL | 0 refills | Status: AC
Start: 2023-02-06 — End: ?

## 2023-02-06 NOTE — Progress Notes (Signed)
Cardiology Office Note:  .   Date:  02/06/2023  ID:  Ashley Cline, DOB October 08, 1955, MRN [id number] PCP: Creola Corn, MD  [facility] Providers Cardiologist:  None     History of Present Illness: .   Ashley Cline is a 67 y.o. female Discussed with the use of AI scribe   History of Present Illness   The 67 year old patient presents for evaluation of chest pain, as referred by Dr. Timothy Lasso. She reports an episode of chest discomfort, left arm pain, and shortness of breath that lasted approximately five minutes and resolved spontaneously. This episode occurred while she was driving to the store, and she describes a fear of dying in the car. Despite her son's suggestion, she declined to go to the hospital. Instead, she [facility], which was normal. She denies experiencing palpitations and reports occasional reflux, which she attributes to hormones. She had a similar episode in the past while in [place], but the workup was negative.  The patient's current medications include olmesartan 20 mg daily for blood pressure and amlodipine 5 mg daily. An EKG performed at an outside office showed normal sinus rhythm without any other abnormalities. Her LDL cholesterol was 116, creatinine 0.8, and TSH 1.3.  The patient denies any history of smoking or drinking. Her mother had high blood pressure and died from a pulmonary embolism, while her father had no heart problems. However, she reports a family history of heart problems on her mother's side. She also mentions a recent period of high stress while in [place] for seven months, during which she experienced severe headaches and elevated blood pressure.          ROS: no bleeding, no syncope  Studies Reviewed: Marland Kitchen   EKG Interpretation Date/Time:  Wednesday February 06 2023 10:34:54 EDT Ventricular Rate:  74 PR Interval:  142 QRS Duration:  80 QT Interval:  372 QTC Calculation: 412 R Axis:   -51  Text  Interpretation: Normal sinus rhythm Left anterior fascicular block Minimal voltage criteria for LVH, may be normal variant ( R in aVL ) No previous ECGs available Confirmed by Donato Schultz ([id number]) on 02/06/2023 10:39:19 AM    Results LABS LDL cholesterol: 116 Creatinine: 0.8 TSH: 1.3  DIAGNOSTIC EKG: Sinus rhythm with left anterior fascicular block (02/06/2023)  Risk Assessment/Calculations:            Physical Exam:   VS:  BP 100/64   Pulse 84   Ht 5\' 2"  (1.575 m)   Wt 133 lb 6.4 oz (60.5 kg)   SpO2 96%   BMI 24.40 kg/m    Wt Readings from Last 3 Encounters:  02/06/23 133 lb 6.4 oz (60.5 kg)  04/20/22 136 lb 9.6 oz (62 kg)  03/30/22 136 lb 9.6 oz (62 kg)    GEN: Well nourished, well developed in no acute distress NECK: No JVD; No carotid bruits CARDIAC: RRR, no murmurs, no rubs, no gallops RESPIRATORY:  Clear to auscultation without rales, wheezing or rhonchi  ABDOMEN: Soft, non-tender, non-distended EXTREMITIES:  No edema; No deformity   ASSESSMENT AND PLAN: .    Assessment and Plan    Chest Pain Recent episode of chest discomfort, left arm pain, and shortness of breath lasting approximately five minutes. No associated palpitations. EKG shows sinus rhythm with left anterior fascicular block. No prior history of heart disease. Family history of pulmonary embolism in mother. -Order coronary CT scan to evaluate for possible coronary artery disease. -PRN  follow-up based on CT scan results. -Her daughter asked about checking a troponin but since it did happen remotely this would be of no significant clinical utility.  Hypertension Controlled on Olmesartan 20mg  daily and Amlodipine 5mg  daily. -Continue current medications.              Signed, Donato Schultz, MD

## 2023-02-06 NOTE — Patient Instructions (Addendum)
Medication Instructions:   *If you need a refill on your cardiac medications before your next appointment, please call your pharmacy*   Lab Work: BMET FOR CT TODAY  If you have labs (blood work) drawn today and your tests are completely normal, you will receive your results only by: MyChart Message (if you have MyChart) OR A paper copy in the mail If you have any lab test that is abnormal or we need to change your treatment, we will call you to review the results.   Testing/Procedures:   Your cardiac CT will be scheduled at one of the below locations:   [facility] [address] [place], [place] [address] [phone]   At [facility], please arrive at the [facility] and Children's Entrance (Entrance C2) of [facility] 30 minutes prior to test start time. You can use the FREE valet parking offered at entrance C (encouraged to control the heart rate for the test)  Proceed to the [facility] Radiology Department (first floor) to check-in and test prep.  All radiology patients and guests should use entrance C2 at [facility], accessed from [facility], even though the hospital's physical address listed is [address]     Please follow these instructions carefully (unless otherwise directed):  An IV will be required for this test and Nitroglycerin will be given.   On the Night Before the Test: Be sure to Drink plenty of water. Do not consume any caffeinated/decaffeinated beverages or chocolate 12 hours prior to your test. Do not take any antihistamines 12 hours prior to your test.  On the Day of the Test: Drink plenty of water until 1 hour prior to the test. Do not eat any food 1 hour prior to test. You may take your regular medications prior to the test.  Take metoprolol (Lopressor) two hours prior to test. Sent to your pharmacy.  FEMALES- please wear underwire-free bra if available, avoid dresses & tight  clothing   After the Test: Drink plenty of water. After receiving IV contrast, you may experience a mild flushed feeling. This is normal. On occasion, you may experience a mild rash up to 24 hours after the test. This is not dangerous. If this occurs, you can take Benadryl 25 mg and increase your fluid intake. If you experience trouble breathing, this can be serious. If it is severe call 911 IMMEDIATELY. If it is mild, please call our office.  We will call to schedule your test 2-4 weeks out understanding that some insurance companies will need an authorization prior to the service being performed.   For more information and frequently asked questions, please visit our website : [email or web address]  For non-scheduling related questions, please contact the cardiac imaging nurse navigator should you have any questions/concerns: Cardiac Imaging Nurse Navigators Direct Office Dial: [phone]   For scheduling needs, including cancellations and rescheduling, please call [place], [phone].    Follow-Up: At [facility], you and your health needs are our priority.  As part of our continuing mission to provide you with exceptional heart care, we have created designated Provider Care Teams.  These Care Teams include your primary Cardiologist (physician) and Advanced Practice Providers (APPs -  Physician Assistants and Nurse Practitioners) who all work together to provide you with the care you need, when you need it.  We recommend signing up for the patient portal called "MyChart".  Sign up information is provided on this After Visit Summary.  MyChart  is used to connect with patients for Virtual Visits (Telemedicine).  Patients are able to view lab/test results, encounter notes, upcoming appointments, etc.  Non-urgent messages can be sent to your provider as well.   To learn more about what you can do with MyChart, go to [email or web address].    Your next  appointment:  AS NEEDED AFTER TESTING

## 2023-02-07 LAB — BASIC METABOLIC PANEL
BUN/Creatinine Ratio: 34 — ABNORMAL HIGH (ref 12–28)
BUN: 20 mg/dL (ref 8–27)
CO2: 24 mmol/L (ref 20–29)
Calcium: 9.3 mg/dL (ref 8.7–10.3)
Chloride: 102 mmol/L (ref 96–106)
Creatinine, Ser: 0.58 mg/dL (ref 0.57–1.00)
Glucose: 86 mg/dL (ref 70–99)
Potassium: 4.3 mmol/L (ref 3.5–5.2)
Sodium: 137 mmol/L (ref 134–144)
eGFR: 98 mL/min/{1.73_m2} (ref >59)

## 2023-02-11 ENCOUNTER — Encounter: Payer: Self-pay | Admitting: *Deleted

## 2023-02-14 ENCOUNTER — Telehealth: Payer: Self-pay | Admitting: Cardiology

## 2023-02-14 NOTE — Telephone Encounter (Signed)
Attempted to contact pt regarding this message.  No answer.  Left message (OK per DPR) lab results were mailed to her home address and she will receive them soon.  Advised Dr Anne Fu does not order antibiotics for ears and she will need to f/u with her PCP for that.

## 2023-02-14 NOTE — Telephone Encounter (Signed)
patient said that nobody called her back to discuss her lab work

## 2023-02-14 NOTE — Telephone Encounter (Signed)
Patient also wants to know if there will be any antiboitics prescribed to her patient ears.

## 2023-02-20 ENCOUNTER — Telehealth (HOSPITAL_COMMUNITY): Payer: Self-pay | Admitting: *Deleted

## 2023-02-20 NOTE — Telephone Encounter (Signed)
Attempted to call patient regarding upcoming cardiac CT appointment. Left message on voicemail with name and callback number Hayley Sharpe RN Navigator Cardiac Imaging [facility] [phone] Office   

## 2023-02-21 ENCOUNTER — Ambulatory Visit (HOSPITAL_COMMUNITY): Payer: Medicare Other

## 2023-04-09 ENCOUNTER — Encounter (HOSPITAL_COMMUNITY): Payer: Self-pay

## 2023-04-25 ENCOUNTER — Other Ambulatory Visit: Payer: Self-pay | Admitting: Internal Medicine

## 2023-04-25 DIAGNOSIS — Z1231 Encounter for screening mammogram for malignant neoplasm of breast: Secondary | ICD-10-CM

## 2023-05-16 ENCOUNTER — Other Ambulatory Visit: Payer: Self-pay | Admitting: Internal Medicine

## 2023-05-16 DIAGNOSIS — Z1231 Encounter for screening mammogram for malignant neoplasm of breast: Secondary | ICD-10-CM

## 2023-05-30 ENCOUNTER — Ambulatory Visit
Admission: RE | Admit: 2023-05-30 | Discharge: 2023-05-30 | Disposition: A | Discharge status: Home / Self Care | Payer: Medicare Other | Source: Ambulatory Visit | Attending: Internal Medicine | Admitting: Internal Medicine

## 2023-05-30 DIAGNOSIS — Z1231 Encounter for screening mammogram for malignant neoplasm of breast: Secondary | ICD-10-CM

## 2023-10-03 ENCOUNTER — Encounter (INDEPENDENT_AMBULATORY_CARE_PROVIDER_SITE_OTHER): Payer: Self-pay | Admitting: Otolaryngology

## 2023-10-03 ENCOUNTER — Ambulatory Visit (INDEPENDENT_AMBULATORY_CARE_PROVIDER_SITE_OTHER): Admitting: Otolaryngology

## 2023-10-03 ENCOUNTER — Ambulatory Visit (INDEPENDENT_AMBULATORY_CARE_PROVIDER_SITE_OTHER): Admitting: Audiology

## 2023-10-03 VITALS — BP 117/81 | HR 76 | Ht 62.0 in | Wt 130.0 lb

## 2023-10-03 DIAGNOSIS — H6123 Impacted cerumen, bilateral: Secondary | ICD-10-CM

## 2023-10-03 DIAGNOSIS — H7320 Unspecified myringitis, unspecified ear: Secondary | ICD-10-CM

## 2023-10-03 DIAGNOSIS — H7291 Unspecified perforation of tympanic membrane, right ear: Secondary | ICD-10-CM | POA: Diagnosis not present

## 2023-10-03 DIAGNOSIS — Z9621 Cochlear implant status: Secondary | ICD-10-CM | POA: Diagnosis not present

## 2023-10-03 DIAGNOSIS — H61302 Acquired stenosis of left external ear canal, unspecified: Secondary | ICD-10-CM | POA: Diagnosis not present

## 2023-10-03 DIAGNOSIS — H90A31 Mixed conductive and sensorineural hearing loss, unilateral, right ear with restricted hearing on the contralateral side: Secondary | ICD-10-CM

## 2023-10-03 DIAGNOSIS — H6993 Unspecified Eustachian tube disorder, bilateral: Secondary | ICD-10-CM

## 2023-10-03 DIAGNOSIS — Z9089 Acquired absence of other organs: Secondary | ICD-10-CM

## 2023-10-03 MED ORDER — NEOMYCIN-POLYMYXIN-HC 3.5-10000-1 OT SOLN: 4.0000 [drp] | OTIC | 1 refills | Status: AC

## 2023-10-03 NOTE — Progress Notes (Signed)
 Dear Dr. Jodelle Mungo, Here is my assessment for our mutual patient, Ashley Cline. Thank you for allowing me the opportunity to care for your patient. Please do not hesitate to contact me should you have any other questions. Sincerely, Dr. Milon Aloe  Otolaryngology Clinic Note Referring provider: Dr. Jodelle Mungo HPI:  Ashley Cline is a 68 y.o. female kindly referred by Dr. Jodelle Mungo for evaluation of left ear issues.  Initial visit (09/2023): Patient reports: Lonststanding history of L > R ear issues. 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   Ultimately, she has had undergone b/l tympanomastoidectomy and right BAHA and has been following Dr. Jodelle Mungo for many years, most recently seen in Apil 2025, at which time she was noted to have left ear drainage and discomfort and underwent cleaning and use of tobradex  drops.   Since that time, she reports that she is doing ok -- she wants to establish care in [place] given difficulties traveling to [facility]. She denies any ear discomfort at this time, but does report left ear is starting to become full. She was noted to have chronic issues with wearing hearing aids and when she wears them, she appears to get infections. She does keep water of her ears. Does not wear HA on left. Last HT was a few years ago.  She does have a fluoroquinolone allergy  Patient currently denies: ear pain, vertigo, drainage, tinnitus Patient also denies barotrauma, vestibular suppressant use, ototoxic medication use Prior ear surgery: see above - b/l tympmastoid and right BAHA  H&N Surgery: see above Personal or FHx of bleeding dz or anesthesia difficulty: no  Tobacco: no  Independent Review of Additional Tests or Records:  Dr. Jodelle Mungo notes (multiple from 2022, 2023, and now in 2025): summarized above but requiring  b/l tympmastoid, right BAHA, and left ear drainage and issues. Ear cleaned, otic drops started; Dx: b/l HL, b/l myringitis; Ref to me for continued care Prior audiograms and ear culture:   PMH/Meds/All/SocHx/FamHx/ROS:   Past Medical History:  Diagnosis Date   Breast pain    Chest pain    Dysuria    Gastritis, Helicobacter pylori    GERD without esophagitis    Goiter    Hearing loss    Hearing loss    Hematuria    Hyperlipidemia    Hypertension    Migraines    Nephrolithiasis    Paresthesia of skin    Renal stone    Toenail deformity    Varicose veins of both lower extremities    Vitamin D deficiency      Past Surgical History:  Procedure Laterality Date   c-section     CESAREAN SECTION     EYE SURGERY     lt. ear surgery     obaha implant     for hearing loss   TONSILLECTOMY     AS A CHILD   WRISIT Left    BROKEN    Family History  Problem Relation Age of Onset   Kidney cancer Sister    Kidney Stones Sister    Kidney Stones Brother    Colon cancer Neg Hx    Colon polyps Neg Hx    Crohn's disease Neg Hx    Esophageal cancer Neg Hx    Rectal cancer Neg Hx    Stomach cancer Neg Hx    Ulcerative colitis Neg  Hx      Social Connections: Not on file      Current Outpatient Medications:    amLODipine (NORVASC) 5 MG tablet, Take by mouth., Disp: , Rfl:    Cholecalciferol 50 MCG (2000 UT) CAPS, Take 1 capsule by mouth daily., Disp: , Rfl:    hydrocortisone 2.5 % ointment, Apply topically., Disp: , Rfl:    metoprolol  tartrate (LOPRESSOR ) 100 MG tablet, Take one tablet (100 mg) by mouth 2 hours prior to your cardiac CT., Disp: 1 tablet, Rfl: 0   Na Sulfate-K Sulfate-Mg Sulf 17.5-3.13-1.6 GM/177ML SOLN, SMARTSIG:1 Kit(s) By Mouth Once, Disp: , Rfl:    neomycin-polymyxin-hydrocortisone (CORTISPORIN) OTIC solution, Place 4 drops into both ears once a week for 7 days., Disp: 10 mL, Rfl: 1   olmesartan (BENICAR) 20 MG tablet, , Disp: , Rfl:     tobramycin -dexamethasone  (TOBRADEX ) ophthalmic solution, Place 4 drops into the right eye in the morning, at noon, and at bedtime., Disp: 5 mL, Rfl: 0   Physical Exam:   BP 117/81   Pulse 76   Ht 5\' 2"  (1.575 m)   Wt 130 lb (59 kg)   SpO2 95%   BMI 23.78 kg/m   Salient findings:  CN II-XII intact Given history and complaints, ear microscopy was indicated and performed for evaluation with findings as below in physical exam section and in procedures; right cerumen impaction, left ceruminous debris impaction; after clearance, no purulence;  AD: EAC clear with TM thickened, unable to assess malleus but no epithelial debris; 20% anteroinferior central perforation;  AS: very narrow meatus; after clearance of impaction, noted TM intact but unable to visualize anterior-most part - posterior myringitis; no evidence of cholesteatoma or pars flaccida, able to visualize handle of malleus well. B/l postauricular incisions well healed; Right BAHA without skin overgrowth, no evidence of infection Weber 512: right Anterior rhinoscopy: Septum intact; bilateral inferior turbinates without significant hypertrophy No lesions of oral cavity/oropharynx No respiratory distress or stridor  Seprately Identifiable Procedures:  Prior to initiating any procedures, risks/benefits/alternatives were explained to the patient and verbal consent obtained. Procedure: Bilateral ear microscopy and cerumen removal using microscope (CPT [phone]) - Mod 2 Pre-procedure diagnosis: Cerumen impaction bilateral external ears; bilateral myringitis; bilateral hearing loss; right tympanic membrane perforation Post-procedure diagnosis: same Indication: see above; given patient's otologic complaints and history as well as for improved and comprehensive examination of external ear and tympanic membrane, bilateral otologic examination using microscope was performed and impacted cerumen removed  Procedure: Patient was placed semi-recumbent.  Both ear canals were examined using the microscope with findings above. Impacted cerumen and debris removed on left and on right using suction and currette with improvement in EAC examination and patency. CSF powder applied left Patient tolerated the procedure well.      Impression & Plans:  Ashley Cline is a 68 y.o. female with:  1. Perforation of right tympanic membrane   2. Myringitis   3. Bilateral impacted cerumen   4. History of bilateral mastoidectomy   5. External ear canal stenosis, acquired, left   6. Status post placement of bone anchored hearing aid (BAHA)   7. Mixed conductive and sensorineural hearing loss of right ear with restricted hearing of left ear   8. Dysfunction of both eustachian tubes    Long otologic hx with b/l tympmastoids and Right BAHA with right mixed HL and left SNHL (based on report, do not have audios themselves). Noted b/l impacted cerumen and debris and frequent left exacerbations, most  likely due to stenotic canal.   - Continue to not wear HA in left given frequent exacerbation of infection when she does; if she wishes for this, would rec meatoplasty first - continue Right BAHA - Cortisporin drops weekly each ear - f/u in 4 months with audiogram  See below regarding exact medications prescribed this encounter including dosages and route: Meds ordered this encounter  Medications   neomycin-polymyxin-hydrocortisone (CORTISPORIN) OTIC solution    Sig: Place 4 drops into both ears once a week for 7 days.    Dispense:  10 mL    Refill:  1      Thank you for allowing me the opportunity to care for your patient. Please do not hesitate to contact me should you have any other questions.  Sincerely, Milon Aloe, MD Otolaryngologist (ENT), [facility] Phone: [phone] Fax: [phone]  10/03/2023, 12:17 PM   MDM:  Level 4 - 99204 Complexity/Problems addressed: mod - multiple chronic problems Data complexity: mod  independent review of multiple notes, ordering tests - Morbidity: mod  - Prescription Drug prescribed or managed: y

## 2024-02-03 ENCOUNTER — Ambulatory Visit (INDEPENDENT_AMBULATORY_CARE_PROVIDER_SITE_OTHER): Admitting: Audiology

## 2024-02-03 ENCOUNTER — Encounter (INDEPENDENT_AMBULATORY_CARE_PROVIDER_SITE_OTHER): Payer: Self-pay

## 2024-02-03 ENCOUNTER — Ambulatory Visit (INDEPENDENT_AMBULATORY_CARE_PROVIDER_SITE_OTHER): Admitting: Otolaryngology

## 2024-02-04 ENCOUNTER — Encounter (INDEPENDENT_AMBULATORY_CARE_PROVIDER_SITE_OTHER): Payer: Self-pay | Admitting: Otolaryngology

## 2024-02-04 ENCOUNTER — Ambulatory Visit (INDEPENDENT_AMBULATORY_CARE_PROVIDER_SITE_OTHER): Admitting: Audiology

## 2024-02-04 ENCOUNTER — Ambulatory Visit (INDEPENDENT_AMBULATORY_CARE_PROVIDER_SITE_OTHER): Admitting: Otolaryngology

## 2024-02-04 VITALS — BP 119/80 | HR 82 | Ht 62.0 in | Wt 130.0 lb

## 2024-02-04 DIAGNOSIS — H7320 Unspecified myringitis, unspecified ear: Secondary | ICD-10-CM | POA: Diagnosis not present

## 2024-02-04 DIAGNOSIS — H90A31 Mixed conductive and sensorineural hearing loss, unilateral, right ear with restricted hearing on the contralateral side: Secondary | ICD-10-CM

## 2024-02-04 DIAGNOSIS — H6993 Unspecified Eustachian tube disorder, bilateral: Secondary | ICD-10-CM

## 2024-02-04 DIAGNOSIS — H7291 Unspecified perforation of tympanic membrane, right ear: Secondary | ICD-10-CM | POA: Diagnosis not present

## 2024-02-04 DIAGNOSIS — H6123 Impacted cerumen, bilateral: Secondary | ICD-10-CM | POA: Diagnosis not present

## 2024-02-04 DIAGNOSIS — Z9621 Cochlear implant status: Secondary | ICD-10-CM

## 2024-02-04 DIAGNOSIS — H61302 Acquired stenosis of left external ear canal, unspecified: Secondary | ICD-10-CM

## 2024-02-04 DIAGNOSIS — Z9089 Acquired absence of other organs: Secondary | ICD-10-CM

## 2024-02-04 NOTE — Progress Notes (Signed)
 Dear Dr. Onita, Here is my assessment for our mutual patient, Ashley Cline. Thank you for allowing me the opportunity to care for your patient. Please do not hesitate to contact me should you have any other questions. Sincerely, Dr. Eldora Blanch  Otolaryngology Clinic Note Referring provider: Dr. Onita HPI:  Ashley Cline is a 68 y.o. female kindly referred by Dr. Onita for evaluation of left ear issues.  Initial visit (09/2023): Patient reports: Lonststanding history of L > R ear issues. 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   Ultimately, she has had undergone b/l tympanomastoidectomy and right BAHA and has been following Dr. Thaddeus for many years, most recently seen in Apil 2025, at which time she was noted to have left ear drainage and discomfort and underwent cleaning and use of tobradex  drops.   Since that time, she reports that she is doing ok -- she wants to establish care in [place] given difficulties traveling to [facility]. She denies any ear discomfort at this time, but does report left ear is starting to become full. She was noted to have chronic issues with wearing hearing aids and when she wears them, she appears to get infections. She does keep water of her ears. Does not wear HA on left. Last HT was a few years ago.  She does have a fluoroquinolone allergy  Patient currently denies: ear pain, vertigo, drainage, tinnitus Patient also denies barotrauma, vestibular suppressant use, ototoxic medication use Prior ear surgery: see above - b/l tympmastoid and right BAHA  --------------------------------------------------------- 02/04/2024 She reports that she is otherwise doing fairly well. Right BAHA seems to be causing some issues. No ear discomfort, left ear is getting more full. Keeping water out of the ears.  No drainage from ears, no vertigo, no pain. Unable to adjust BAHA here today given equipment limitations.   H&N Surgery: see above Personal or FHx of bleeding dz or anesthesia difficulty: no  Tobacco: no  Independent Review of Additional Tests or Records:  Dr. Thaddeus notes (multiple from 2022, 2023, and now in 2025): summarized above but requiring b/l tympmastoid, right BAHA, and left ear drainage and issues. Ear cleaned, otic drops started; Dx: b/l HL, b/l myringitis; Ref to me for continued care  Audio 02/04/2024: unable to perform  Prior audiograms and ear culture:   PMH/Meds/All/SocHx/FamHx/ROS:   Past Medical History:  Diagnosis Date   Breast pain    Chest pain    Dysuria    Gastritis, Helicobacter pylori    GERD without esophagitis    Goiter    Hearing loss    Hearing loss    Hematuria    Hyperlipidemia    Hypertension    Migraines    Nephrolithiasis    Paresthesia of skin    Renal stone    Toenail deformity    Varicose veins of both lower extremities    Vitamin D deficiency      Past Surgical History:  Procedure Laterality Date   c-section     CESAREAN SECTION     EYE SURGERY     lt. ear surgery     obaha implant     for hearing loss   TONSILLECTOMY     AS A CHILD   WRISIT Left    BROKEN    Family History  Problem Relation Age of Onset   Kidney cancer Sister  Kidney Stones Sister    Kidney Stones Brother    Colon cancer Neg Hx    Colon polyps Neg Hx    Crohn's disease Neg Hx    Esophageal cancer Neg Hx    Rectal cancer Neg Hx    Stomach cancer Neg Hx    Ulcerative colitis Neg Hx      Social Connections: Not on file      Current Outpatient Medications:    amLODipine (NORVASC) 5 MG tablet, Take by mouth., Disp: , Rfl:    Cholecalciferol 50 MCG (2000 UT) CAPS, Take 1 capsule by mouth daily., Disp: , Rfl:    hydrocortisone 2.5 % ointment, Apply topically., Disp: , Rfl:    metoprolol  tartrate (LOPRESSOR ) 100 MG tablet, Take one tablet (100  mg) by mouth 2 hours prior to your cardiac CT., Disp: 1 tablet, Rfl: 0   Na Sulfate-K Sulfate-Mg Sulf 17.5-3.13-1.6 GM/177ML SOLN, SMARTSIG:1 Kit(s) By Mouth Once, Disp: , Rfl:    olmesartan (BENICAR) 20 MG tablet, , Disp: , Rfl:    tobramycin -dexamethasone  (TOBRADEX ) ophthalmic solution, Place 4 drops into the right eye in the morning, at noon, and at bedtime., Disp: 5 mL, Rfl: 0   Physical Exam:   BP 119/80 (BP Location: Right Arm, Patient Position: Sitting, Cuff Size: Normal)   Pulse 82   Ht 5' 2 (1.575 m)   Wt 130 lb (59 kg)   SpO2 96%   BMI 23.78 kg/m   Salient findings:  CN II-XII intact Given history and complaints, ear microscopy was indicated and performed for evaluation with findings as below in physical exam section and in procedures; right cerumen impaction, left ceruminous debris impaction; after clearance, no purulence;  AD: EAC clear with TM thickened, unable to assess malleus but no epithelial debris; 20% anteroinferior central perforation;  AS: very narrow meatus; after clearance of impaction, noted TM intact but unable to visualize anterior-most part -- myringitis is better; no evidence of cholesteatoma or pars flaccida, able to visualize handle of malleus well. B/l postauricular incisions well healed; Right BAHA without skin overgrowth, no evidence of infection Weber 512: right Anterior rhinoscopy: Septum intact; bilateral inferior turbinates without significant hypertrophy No lesions of oral cavity/oropharynx No respiratory distress or stridor  Seprately Identifiable Procedures:  Prior to initiating any procedures, risks/benefits/alternatives were explained to the patient and verbal consent obtained. Procedure: Bilateral ear microscopy and cerumen removal using microscope (CPT [phone]) - Mod 25 Pre-procedure diagnosis: Cerumen impaction bilateral external ears; bilateral myringitis; bilateral hearing loss; right tympanic membrane perforation Post-procedure diagnosis:  same Indication: see above; given patient's otologic complaints and history as well as for improved and comprehensive examination of external ear and tympanic membrane, bilateral otologic examination using microscope was performed and impacted cerumen removed  Procedure: Patient was placed semi-recumbent. Both ear canals were examined using the microscope with findings above. Impacted cerumen and debris removed on left and on right using suction and currette with improvement in EAC examination and patency Patient tolerated the procedure well.      Impression & Plans:  Ashley Cline is a 68 y.o. female with:  1. Perforation of right tympanic membrane   2. Myringitis   3. Bilateral impacted cerumen   4. History of bilateral mastoidectomy   5. External ear canal stenosis, acquired, left   6. Status post placement of bone anchored hearing aid (BAHA)   7. Mixed conductive and sensorineural hearing loss of right ear with restricted hearing of left ear   8. Dysfunction of  both eustachian tubes    Long otologic hx with b/l tympmastoids and Right BAHA with right mixed HL and left SNHL (based on report, do not have audios themselves). Noted b/l impacted cerumen and debris and frequent left exacerbations, most likely due to stenotic canal. This is persistent.   Myringitis has improved significantly.  - Continue to not wear HA in left given frequent exacerbation of infection when she does; if she wishes for this, would rec meatoplasty first - continue Right BAHA --- will refer to [facility] for audiometric assessment - Cortisporin drops if exacerbation - f/u in Feb 2026  See below regarding exact medications prescribed this encounter including dosages and route: No orders of the defined types were placed in this encounter.     Thank you for allowing me the opportunity to care for your patient. Please do not hesitate to contact me should you have any other questions.  Sincerely, Eldora Blanch,  MD Otolaryngologist (ENT), [facility] Phone: [phone] Fax: [phone]  02/04/2024, 2:15 PM   I have personally spent 30 minutes involved in face-to-face and non-face-to-face activities for this patient on the day of the visit.  Professional time spent excludes any procedures performed but includes the following activities, in addition to those noted in the documentation: preparing to see the patient (review of outside documentation and results), performing a medically appropriate examination, counseling, documenting in the electronic health record

## 2024-04-01 ENCOUNTER — Telehealth (INDEPENDENT_AMBULATORY_CARE_PROVIDER_SITE_OTHER): Payer: Self-pay

## 2024-04-01 NOTE — Telephone Encounter (Signed)
 LVM informing patient to contact office regarding message. Spoke to provider and patient can be seen in office next week. Her son will not be able to be seen by Dr. Tobie but will coordinate a date/time for her to see Dr. Tobie and her son to see Dr. Karis on the same day.

## 2024-04-01 NOTE — Telephone Encounter (Signed)
 Patient stated that since the surgery she is still having issues with her ears and needs her son's ears cleaned as well. Patient asked for a call back. Her son is a patient of Karis but wants Dr. Tobie to take care of her and her son.

## 2024-04-03 NOTE — Telephone Encounter (Signed)
 Left a voicemail informing patient that she is scheduled. Also informed her that her son will need a referral to see Dr. Karis since he has not seen him in a while.

## 2024-04-08 ENCOUNTER — Encounter (INDEPENDENT_AMBULATORY_CARE_PROVIDER_SITE_OTHER): Payer: Self-pay | Admitting: Otolaryngology

## 2024-04-08 ENCOUNTER — Ambulatory Visit (INDEPENDENT_AMBULATORY_CARE_PROVIDER_SITE_OTHER): Admitting: Otolaryngology

## 2024-04-08 VITALS — BP 117/80 | HR 78 | Ht 62.0 in | Wt 130.0 lb

## 2024-04-08 DIAGNOSIS — H6123 Impacted cerumen, bilateral: Secondary | ICD-10-CM

## 2024-04-08 DIAGNOSIS — H90A31 Mixed conductive and sensorineural hearing loss, unilateral, right ear with restricted hearing on the contralateral side: Secondary | ICD-10-CM

## 2024-04-08 DIAGNOSIS — H6993 Unspecified Eustachian tube disorder, bilateral: Secondary | ICD-10-CM | POA: Diagnosis not present

## 2024-04-08 DIAGNOSIS — Z9621 Cochlear implant status: Secondary | ICD-10-CM | POA: Diagnosis not present

## 2024-04-08 DIAGNOSIS — H61302 Acquired stenosis of left external ear canal, unspecified: Secondary | ICD-10-CM | POA: Diagnosis not present

## 2024-04-08 DIAGNOSIS — H7291 Unspecified perforation of tympanic membrane, right ear: Secondary | ICD-10-CM | POA: Diagnosis not present

## 2024-04-08 DIAGNOSIS — H7320 Unspecified myringitis, unspecified ear: Secondary | ICD-10-CM | POA: Diagnosis not present

## 2024-04-08 DIAGNOSIS — Z9089 Acquired absence of other organs: Secondary | ICD-10-CM

## 2024-04-08 MED ORDER — AMOXICILLIN-POT CLAVULANATE 875-125 MG PO TABS: 1.0000 | ORAL_TABLET | Freq: Two times a day (BID) | ORAL | 0 refills | Status: AC

## 2024-04-08 NOTE — Patient Instructions (Signed)
 Take Augmentin  875 mg by mouth (PO) twice daily for 10 days; take with food, take probiotic or yogurt with it AFTER 1 week, use the drops (4 drops twice per day) for cortisporin drops for 2 weeks

## 2024-04-08 NOTE — Progress Notes (Signed)
 Dear Dr. Onita, Here is my assessment for our mutual patient, Ashley Cline. Thank you for allowing me the opportunity to care for your patient. Please do not hesitate to contact me should you have any other questions. Sincerely, Dr. Eldora Blanch  Otolaryngology Clinic Note Referring provider: Dr. Onita HPI:  Ashley Cline is a 68 y.o. female kindly referred by Dr. Onita for evaluation of left ear issues.  Initial visit (09/2023): Patient reports: Lonststanding history of L > R ear issues. 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   Ultimately, she has had undergone b/l tympanomastoidectomy and right BAHA and has been following Dr. Thaddeus for many years, most recently seen in Apil 2025, at which time she was noted to have left ear drainage and discomfort and underwent cleaning and use of tobradex  drops.   Since that time, she reports that she is doing ok -- she wants to establish care in [place] given difficulties traveling to [facility]. She denies any ear discomfort at this time, but does report left ear is starting to become full. She was noted to have chronic issues with wearing hearing aids and when she wears them, she appears to get infections. She does keep water of her ears. Does not wear HA on left. Last HT was a few years ago.  She does have a fluoroquinolone allergy  Patient currently denies: ear pain, vertigo, drainage, tinnitus Patient also denies barotrauma, vestibular suppressant use, ototoxic medication use Prior ear surgery: see above - b/l tympmastoid and right BAHA  --------------------------------------------------------- 02/04/2024 She reports that she is otherwise doing fairly well. Right BAHA seems to be causing some issues. No ear discomfort, left ear is getting more full. Keeping water out of the ears.  No drainage from ears, no vertigo, no pain. Unable to adjust BAHA here today given equipment limitations.  --------------------------------------------------------- 04/08/2024 She has been having left sided and right sided drainage bilaterally, right more painful than left. She has been using cortisporin drops without significant benefit. She is not getting water in the ear. No tinnitus. Otherwise hearing no change.    H&N Surgery: see above Personal or FHx of bleeding dz or anesthesia difficulty: no  Tobacco: no  Independent Review of Additional Tests or Records:  Dr. Thaddeus notes (multiple from 2022, 2023, and now in 2025): summarized above but requiring b/l tympmastoid, right BAHA, and left ear drainage and issues. Ear cleaned, otic drops started; Dx: b/l HL, b/l myringitis; Ref to me for continued care  Audio 02/04/2024: unable to perform  Prior audiograms and ear culture:   PMH/Meds/All/SocHx/FamHx/ROS:   Past Medical History:  Diagnosis Date   Breast pain    Chest pain    Dysuria    Gastritis, Helicobacter pylori    GERD without esophagitis    Goiter    Hearing loss    Hearing loss    Hematuria    Hyperlipidemia    Hypertension    Migraines    Nephrolithiasis    Paresthesia of skin    Renal stone    Toenail deformity    Varicose veins of both lower extremities    Vitamin D deficiency      Past Surgical History:  Procedure Laterality Date   c-section     CESAREAN SECTION     EYE SURGERY     lt. ear surgery  obaha implant     for hearing loss   TONSILLECTOMY     AS A CHILD   WRISIT Left    BROKEN    Family History  Problem Relation Age of Onset   Kidney cancer Sister    Kidney Stones Sister    Kidney Stones Brother    Colon cancer Neg Hx    Colon polyps Neg Hx    Crohn's disease Neg Hx    Esophageal cancer Neg Hx    Rectal cancer Neg Hx    Stomach cancer Neg Hx    Ulcerative colitis Neg Hx      Social Connections: Not on file       Current Outpatient Medications:    amLODipine (NORVASC) 5 MG tablet, Take by mouth., Disp: , Rfl:    amoxicillin -clavulanate (AUGMENTIN ) 875-125 MG tablet, Take 1 tablet by mouth 2 (two) times daily for 10 days., Disp: 20 tablet, Rfl: 0   Cholecalciferol 50 MCG (2000 UT) CAPS, Take 1 capsule by mouth daily., Disp: , Rfl:    hydrocortisone 2.5 % ointment, Apply topically., Disp: , Rfl:    metoprolol  tartrate (LOPRESSOR ) 100 MG tablet, Take one tablet (100 mg) by mouth 2 hours prior to your cardiac CT., Disp: 1 tablet, Rfl: 0   Na Sulfate-K Sulfate-Mg Sulf 17.5-3.13-1.6 GM/177ML SOLN, SMARTSIG:1 Kit(s) By Mouth Once, Disp: , Rfl:    olmesartan (BENICAR) 20 MG tablet, , Disp: , Rfl:    tobramycin -dexamethasone  (TOBRADEX ) ophthalmic solution, Place 4 drops into the right eye in the morning, at noon, and at bedtime., Disp: 5 mL, Rfl: 0   Physical Exam:   BP 117/80 (BP Location: Right Arm, Patient Position: Sitting, Cuff Size: Large)   Pulse 78   Ht 5' 2 (1.575 m)   Wt 130 lb (59 kg)   SpO2 93%   BMI 23.78 kg/m   Salient findings:  CN II-XII intact Given history and complaints, ear microscopy was indicated and performed for evaluation with findings as below in physical exam section and in procedures; right cerumen impaction, left ceruminous debris impaction; after clearance, no purulence;  AD: EAC clear with TM thickened, unable to assess malleus but no epithelial debris impaction; 20% anteroinferior central perforation, clean, mild drainage AS: very narrow meatus; after clearance of impaction, noted TM intact but unable to visualize anterior-most part -- myringitis is worse today, no evidence of cholesteatoma or pars flaccida, able to visualize handle of malleus; CSF powder applied B/l postauricular incisions well healed; Right BAHA without skin overgrowth, no evidence of infection Weber 512: right Anterior rhinoscopy: Septum intact; bilateral inferior turbinates without significant  hypertrophy No lesions of oral cavity/oropharynx No respiratory distress or stridor  Seprately Identifiable Procedures:  Prior to initiating any procedures, risks/benefits/alternatives were explained to the patient and verbal consent obtained. Procedure: Bilateral ear microscopy and cerumen removal using microscope (CPT [id number]) - Mod 25 Pre-procedure diagnosis: Cerumen impaction bilateral external ears; bilateral myringitis; bilateral hearing loss; right tympanic membrane perforation Post-procedure diagnosis: same Indication: see above; given patient's otologic complaints and history as well as for improved and comprehensive examination of external ear and tympanic membrane, bilateral otologic examination using microscope was performed and impacted cerumen removed  Procedure: Patient was placed semi-recumbent. Both ear canals were examined using the microscope with findings above. Impacted cerumen and debris removed on left and on right using suction and currette with improvement in EAC examination and patency. Bilateral CSF powder placed Patient tolerated the procedure well.  Impression & Plans:  Ashley Cline is a 68 y.o. female with:  1. Perforation of right tympanic membrane   2. Myringitis   3. Bilateral impacted cerumen   4. History of bilateral mastoidectomy   5. External ear canal stenosis, acquired, left   6. Status post placement of bone anchored hearing aid (BAHA)   7. Mixed conductive and sensorineural hearing loss of right ear with restricted hearing of left ear   8. Dysfunction of both eustachian tubes    Long otologic hx with b/l tympmastoids and Right BAHA with right mixed HL and left SNHL (based on report, do not have audios themselves). Noted b/l impacted cerumen and debris and frequent left exacerbations, now with another exacerbation for myringitis.   - Placed CSF powder today; start drops after 1 week for 2 weeks - Augmentin  BID x10e - Continue to not wear  HA in left given frequent exacerbation of infection when she does; if she wishes for this, would rec meatoplasty first - continue Right BAHA - f/u in 3 weeks  See below regarding exact medications prescribed this encounter including dosages and route: Meds ordered this encounter  Medications   amoxicillin -clavulanate (AUGMENTIN ) 875-125 MG tablet    Sig: Take 1 tablet by mouth 2 (two) times daily for 10 days.    Dispense:  20 tablet    Refill:  0      Thank you for allowing me the opportunity to care for your patient. Please do not hesitate to contact me should you have any other questions.  Sincerely, Eldora Blanch, MD Otolaryngologist (ENT), [facility] Phone: [phone] Fax: [phone]  04/08/2024, 8:23 AM   I have personally spent 31 minutes involved in face-to-face and non-face-to-face activities for this patient on the day of the visit.  Professional time spent excludes any procedures performed but includes the following activities, in addition to those noted in the documentation: preparing to see the patient (review of outside documentation and results), performing a medically appropriate examination, counseling, documenting in the electronic health record

## 2024-04-27 ENCOUNTER — Ambulatory Visit (INDEPENDENT_AMBULATORY_CARE_PROVIDER_SITE_OTHER): Admitting: Otolaryngology

## 2024-04-27 ENCOUNTER — Encounter (INDEPENDENT_AMBULATORY_CARE_PROVIDER_SITE_OTHER): Payer: Self-pay | Admitting: Otolaryngology

## 2024-04-27 VITALS — BP 119/78 | HR 86 | Ht 62.0 in | Wt 130.0 lb

## 2024-04-27 DIAGNOSIS — H61302 Acquired stenosis of left external ear canal, unspecified: Secondary | ICD-10-CM

## 2024-04-27 DIAGNOSIS — H6122 Impacted cerumen, left ear: Secondary | ICD-10-CM

## 2024-04-27 DIAGNOSIS — Z9009 Acquired absence of other part of head and neck: Secondary | ICD-10-CM | POA: Diagnosis not present

## 2024-04-27 DIAGNOSIS — Z9621 Cochlear implant status: Secondary | ICD-10-CM

## 2024-04-27 DIAGNOSIS — H7291 Unspecified perforation of tympanic membrane, right ear: Secondary | ICD-10-CM

## 2024-04-27 DIAGNOSIS — H7320 Unspecified myringitis, unspecified ear: Secondary | ICD-10-CM | POA: Diagnosis not present

## 2024-04-27 DIAGNOSIS — Z9089 Acquired absence of other organs: Secondary | ICD-10-CM

## 2024-04-27 DIAGNOSIS — H6993 Unspecified Eustachian tube disorder, bilateral: Secondary | ICD-10-CM

## 2024-04-27 DIAGNOSIS — H90A31 Mixed conductive and sensorineural hearing loss, unilateral, right ear with restricted hearing on the contralateral side: Secondary | ICD-10-CM | POA: Diagnosis not present

## 2024-04-27 NOTE — Progress Notes (Signed)
 Dear Dr. Onita, Here is my assessment for our mutual patient, Ashley Cline. Thank you for allowing me the opportunity to care for your patient. Please do not hesitate to contact me should you have any other questions. Sincerely, Dr. Eldora Blanch  Otolaryngology Clinic Note Referring provider: Dr. Onita HPI:  Ashley Cline is a 68 y.o. female kindly referred by Dr. Onita for evaluation of left ear issues.  Initial visit (09/2023): Patient reports: Lonststanding history of L > R ear issues. 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   Ultimately, she has had undergone b/l tympanomastoidectomy and right BAHA and has been following Dr. Thaddeus for many years, most recently seen in Apil 2025, at which time she was noted to have left ear drainage and discomfort and underwent cleaning and use of tobradex  drops.   Since that time, she reports that she is doing ok -- she wants to establish care in [place] given difficulties traveling to [facility]. She denies any ear discomfort at this time, but does report left ear is starting to become full. She was noted to have chronic issues with wearing hearing aids and when she wears them, she appears to get infections. She does keep water of her ears. Does not wear HA on left. Last HT was a few years ago.  She does have a fluoroquinolone allergy  Patient currently denies: ear pain, vertigo, drainage, tinnitus Patient also denies barotrauma, vestibular suppressant use, ototoxic medication use Prior ear surgery: see above - b/l tympmastoid and right BAHA  --------------------------------------------------------- 02/04/2024 She reports that she is otherwise doing fairly well. Right BAHA seems to be causing some issues. No ear discomfort, left ear is getting more full. Keeping water out of the ears.  No drainage from ears, no vertigo, no pain. Unable to adjust BAHA here today given equipment limitations.  --------------------------------------------------------- 04/08/2024 She has been having left sided and right sided drainage bilaterally, right more painful than left. She has been using cortisporin drops without significant benefit. She is not getting water in the ear. No tinnitus. Otherwise hearing no change. --------------------------------------------------------- 04/27/2024 Feeling significantly better. No drainage, no pain, no tinnitus. Hearing better. Feels like left ear has wax/plugged feeling   H&N Surgery: see above Personal or FHx of bleeding dz or anesthesia difficulty: no  Tobacco: no  Independent Review of Additional Tests or Records:  Dr. Thaddeus notes (multiple from 2022, 2023, and now in 2025): summarized above but requiring b/l tympmastoid, right BAHA, and left ear drainage and issues. Ear cleaned, otic drops started; Dx: b/l HL, b/l myringitis; Ref to me for continued care  Audio 02/04/2024: unable to perform  Prior audiograms and ear culture:   PMH/Meds/All/SocHx/FamHx/ROS:   Past Medical History:  Diagnosis Date   Breast pain    Chest pain    Dysuria    Gastritis, Helicobacter pylori    GERD without esophagitis    Goiter    Hearing loss    Hearing loss    Hematuria    Hyperlipidemia    Hypertension    Migraines    Nephrolithiasis    Paresthesia of skin    Renal stone    Toenail deformity    Varicose veins of both lower extremities    Vitamin D deficiency      Past Surgical History:  Procedure Laterality Date   c-section  CESAREAN SECTION     EYE SURGERY     lt. ear surgery     obaha implant     for hearing loss   TONSILLECTOMY     AS A CHILD   WRISIT Left    BROKEN    Family History  Problem Relation Age of Onset   Kidney cancer Sister    Kidney Stones Sister    Kidney Stones Brother    Colon cancer Neg Hx    Colon polyps  Neg Hx    Crohn's disease Neg Hx    Esophageal cancer Neg Hx    Rectal cancer Neg Hx    Stomach cancer Neg Hx    Ulcerative colitis Neg Hx      Social Connections: Not on file      Current Outpatient Medications:    amLODipine (NORVASC) 5 MG tablet, Take by mouth., Disp: , Rfl:    Cholecalciferol 50 MCG (2000 UT) CAPS, Take 1 capsule by mouth daily., Disp: , Rfl:    hydrocortisone 2.5 % ointment, Apply topically., Disp: , Rfl:    metoprolol  tartrate (LOPRESSOR ) 100 MG tablet, Take one tablet (100 mg) by mouth 2 hours prior to your cardiac CT., Disp: 1 tablet, Rfl: 0   Na Sulfate-K Sulfate-Mg Sulf 17.5-3.13-1.6 GM/177ML SOLN, SMARTSIG:1 Kit(s) By Mouth Once, Disp: , Rfl:    olmesartan (BENICAR) 20 MG tablet, , Disp: , Rfl:    tobramycin -dexamethasone  (TOBRADEX ) ophthalmic solution, Place 4 drops into the right eye in the morning, at noon, and at bedtime., Disp: 5 mL, Rfl: 0   Physical Exam:   BP 119/78 (BP Location: Right Arm, Patient Position: Sitting, Cuff Size: Large)   Pulse 86   Ht 5' 2 (1.575 m)   Wt 130 lb (59 kg)   SpO2 93%   BMI 23.78 kg/m   Salient findings:  CN II-XII intact Given history and complaints, ear microscopy was indicated and performed for evaluation with findings as below in physical exam section and in procedures Left ceruminous debris impaction; after clearance, no purulence; AD: EAC clear with TM thickened, 20% anteroinferior central perforation, clean, no drainage AS: very narrow meatus; after clearance of impaction, noted TM intact; no myringitis today, no evidence of cholesteatoma or pars flaccida, able to visualize handle of malleus B/l postauricular incisions well healed; Right BAHA without skin overgrowth, no evidence of infection Weber 512: right Anterior rhinoscopy: Septum intact; bilateral inferior turbinates without significant hypertrophy No lesions of oral cavity/oropharynx No respiratory distress or stridor  Seprately Identifiable  Procedures:  Prior to initiating any procedures, risks/benefits/alternatives were explained to the patient and verbal consent obtained. Procedure: Bilateral ear microscopy and cerumen removal using microscope (CPT [id number]) - Mod 25 Pre-procedure diagnosis: Cerumen impaction left ear; bilateral myringitis; bilateral hearing loss; right tympanic membrane perforation Post-procedure diagnosis: same Indication: see above; given patient's otologic complaints and history as well as for improved and comprehensive examination of external ear and tympanic membrane, bilateral otologic examination using microscope was performed and impacted cerumen removed  Procedure: Patient was placed semi-recumbent. Both ear canals were examined using the microscope with findings above. Impacted cerumen and debris removed on left using suction and currette with improvement in EAC examination and patency Patient tolerated the procedure well.      Impression & Plans:  Ashley Cline is a 68 y.o. female with:  1. Perforation of right tympanic membrane   2. Myringitis   3. History of bilateral mastoidectomy   4. External ear canal stenosis,  acquired, left   5. Status post placement of bone anchored hearing aid (BAHA)   6. Mixed conductive and sensorineural hearing loss of right ear with restricted hearing of left ear   7. Dysfunction of both eustachian tubes   8. Impacted cerumen of left ear    Long otologic hx with b/l tympmastoids and Right BAHA with right mixed HL and left SNHL (based on report, do not have audios themselves).   Noted recent exacerbation improved with CSF powder; left impacted cerumen and debris and frequent left exacerbations, now improved   - Drops left ear for 1 additional week - Continue to not wear HA in left given frequent exacerbation of infection when she does; if she wishes for this, would rec meatoplasty first - continue Right BAHA - f/u in 6 weeks with HT  See below regarding exact  medications prescribed this encounter including dosages and route: No orders of the defined types were placed in this encounter.     Thank you for allowing me the opportunity to care for your patient. Please do not hesitate to contact me should you have any other questions.  Sincerely, Eldora Blanch, MD Otolaryngologist (ENT), [facility] Phone: [phone] Fax: [phone]  04/27/2024, 10:08 AM   MDM: low, low, low

## 2024-06-09 ENCOUNTER — Ambulatory Visit (INDEPENDENT_AMBULATORY_CARE_PROVIDER_SITE_OTHER): Admitting: Otolaryngology

## 2024-06-09 ENCOUNTER — Ambulatory Visit (INDEPENDENT_AMBULATORY_CARE_PROVIDER_SITE_OTHER): Admitting: Audiology
